# Patient Record
Sex: Female | Born: 1995 | Race: White | Hispanic: No | Marital: Married | Smoking: Never smoker
Health system: Southern US, Community
[De-identification: ages and names within clinical notes are randomized; demographics above are authoritative.]

## PROBLEM LIST (undated history)

## (undated) DIAGNOSIS — K649 Unspecified hemorrhoids: Secondary | ICD-10-CM

## (undated) DIAGNOSIS — D649 Anemia, unspecified: Secondary | ICD-10-CM

## (undated) DIAGNOSIS — B2790 Infectious mononucleosis, unspecified without complication: Secondary | ICD-10-CM

## (undated) DIAGNOSIS — S42302A Unspecified fracture of shaft of humerus, left arm, initial encounter for closed fracture: Secondary | ICD-10-CM

## (undated) HISTORY — DX: Infectious mononucleosis, unspecified without complication: B27.90

## (undated) HISTORY — DX: Unspecified hemorrhoids: K64.9

## (undated) HISTORY — DX: Unspecified fracture of shaft of humerus, left arm, initial encounter for closed fracture: S42.302A

## (undated) HISTORY — PX: OTHER SURGICAL HISTORY: SHX169

---
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
## 2017-08-22 ENCOUNTER — Ambulatory Visit: Payer: Managed Care, Other (non HMO) | Admitting: Physician Assistant

## 2017-08-22 ENCOUNTER — Encounter: Payer: Self-pay | Admitting: Physician Assistant

## 2017-08-22 VITALS — BP 102/74 | HR 80 | Temp 98.0°F | Resp 16 | Ht 63.0 in | Wt 110.0 lb

## 2017-08-22 DIAGNOSIS — N926 Irregular menstruation, unspecified: Secondary | ICD-10-CM | POA: Diagnosis not present

## 2017-08-22 DIAGNOSIS — Z13220 Encounter for screening for lipoid disorders: Secondary | ICD-10-CM | POA: Diagnosis not present

## 2017-08-22 DIAGNOSIS — Z1329 Encounter for screening for other suspected endocrine disorder: Secondary | ICD-10-CM

## 2017-08-22 DIAGNOSIS — Z0000 Encounter for general adult medical examination without abnormal findings: Secondary | ICD-10-CM | POA: Diagnosis not present

## 2017-08-22 DIAGNOSIS — Z23 Encounter for immunization: Secondary | ICD-10-CM

## 2017-08-22 DIAGNOSIS — Z2821 Immunization not carried out because of patient refusal: Secondary | ICD-10-CM | POA: Diagnosis not present

## 2017-08-22 DIAGNOSIS — Z131 Encounter for screening for diabetes mellitus: Secondary | ICD-10-CM | POA: Diagnosis not present

## 2017-08-22 DIAGNOSIS — Z114 Encounter for screening for human immunodeficiency virus [HIV]: Secondary | ICD-10-CM

## 2017-08-22 NOTE — Progress Notes (Signed)
Patient: Linda Mendez Female    DOB: 1995/08/27   21 y.o.   MRN: [id number] Visit Date: 08/22/2017  Today's Provider: Trey Sailors, PA-C   Chief Complaint  Patient presents with  . Establish Care   Subjective:    HPI   Linda Mendez is a 22 y/o woman presenting today to establish care. She recently graduated from [employer] where she studied psychology and neuroscience. She is working as a Architectural technologist for first grade.  She is living in [place] with her husband of five months, feels safe and supported in her relationship. She is sexually active, uses condoms for contraception. She has never had a PAP smear.  She reports she has been having issues with her menstrual cycles lately. She says for several years, she has been having periods every other month. In the past year, however, she has been having periods every month. In the first few days, she reports a heavy flow and severe cramping. Then, she reports she will have extended bleeding and spotting.   Also reports difficulty having bowel movements, denies constipation. Says she feels rectal contractions occasionally when having bowel movement. Denies blood in her stool.     Allergies  Allergen Reactions  . Penicillins Hives    No current outpatient medications on file.  Review of Systems  Constitutional: Negative.   HENT: Negative.   Eyes: Negative.   Respiratory: Negative.   Cardiovascular: Negative.   Gastrointestinal: Positive for abdominal pain and rectal pain.  Endocrine: Negative.   Genitourinary: Positive for vaginal pain.  Musculoskeletal: Negative.   Skin: Negative.   Allergic/Immunologic: Positive for environmental allergies.  Neurological: Negative.   Hematological: Negative.   Psychiatric/Behavioral: Negative.     Social History   Tobacco Use  . Smoking status: Never Smoker  . Smokeless tobacco: Never Used  Substance Use Topics  . Alcohol use: Yes    Comment: Occasionally   Objective:     BP 102/74 (BP Location: Right Arm, Patient Position: Sitting, Cuff Size: Normal)   Pulse 80   Temp 98 F (36.7 C) (Oral)   Resp 16   Ht 5\' 3"  (1.6 m)   Wt 110 lb (49.9 kg)   LMP 08/08/2017   BMI 19.49 kg/m  Vitals:   08/22/17 1535  BP: 102/74  Pulse: 80  Resp: 16  Temp: 98 F (36.7 C)  TempSrc: Oral  Weight: 110 lb (49.9 kg)  Height: 5\' 3"  (1.6 m)     Physical Exam  Constitutional: She is oriented to person, place, and time. She appears well-developed and well-nourished.  HENT:  Right Ear: Tympanic membrane and external ear normal.  Left Ear: Tympanic membrane and external ear normal.  Eyes: Conjunctivae are normal.  Neck: Neck supple.  Cardiovascular: Normal rate and regular rhythm.  Pulmonary/Chest: Effort normal and breath sounds normal.  Abdominal: Soft. Bowel sounds are normal.  Lymphadenopathy:    She has no cervical adenopathy.  Neurological: She is alert and oriented to person, place, and time.  Skin: Skin is warm and dry.  Psychiatric: She has a normal mood and affect. Her behavior is normal.        Assessment & Plan:     1. Encounter for medical examination to establish care  - CBC with Differential  2. Irregular periods  Discussed etiologies of menstrual irregularities. Menorrhagia with extended menstrual cycle, DDx: endometriosis vs  PCOS. Discussed initial intervention would likely be form of birth control including OCPs, implants, patch,  nuva ring. Patient says she would like to discuss with her husband. Will follow up 1 mo for CPE w/ Pap where we can discuss her decision. May get fasting labs before then.  3. Need for Td vaccination  Updated today.  4. Influenza vaccination declined   5. Encounter for screening for HIV  - HIV antibody (with reflex)  6. Screening cholesterol level  - Lipid Profile  7. Thyroid disorder screen  - TSH  8. Diabetes mellitus screening  - Comprehensive Metabolic Panel (CMET)  Return in about 1 month  (around 09/22/2017) for CPE with PAP .  The entirety of the information documented in the History of Present Illness, Review of Systems and Physical Exam were personally obtained by me. Portions of this information were initially documented by Kavin LeechLaura Walsh, CMA and reviewed by me for thoroughness and accuracy.        Trey SailorsAdriana M Pollak, PA-C  [facility]

## 2017-08-22 NOTE — Patient Instructions (Signed)

## 2017-08-23 NOTE — Addendum Note (Signed)
Addended by: Kavin LeechWALSH, LAURA E on: 08/23/2017 08:39 AM   Modules accepted: Orders

## 2017-09-13 ENCOUNTER — Telehealth: Payer: Self-pay

## 2017-09-13 LAB — COMPREHENSIVE METABOLIC PANEL
ALT: 21 IU/L (ref 0–32)
AST: 22 IU/L (ref 0–40)
Albumin/Globulin Ratio: 1.8 (ref 1.2–2.2)
Albumin: 4.6 g/dL (ref 3.5–5.5)
Alkaline Phosphatase: 43 IU/L (ref 39–117)
BUN/Creatinine Ratio: 24 — ABNORMAL HIGH (ref 9–23)
BUN: 15 mg/dL (ref 6–20)
Bilirubin Total: 0.5 mg/dL (ref 0.0–1.2)
CO2: 22 mmol/L (ref 20–29)
Calcium: 9.3 mg/dL (ref 8.7–10.2)
Chloride: 103 mmol/L (ref 96–106)
Creatinine, Ser: 0.63 mg/dL (ref 0.57–1.00)
GFR calc Af Amer: 146 mL/min/{1.73_m2} (ref >59)
GFR calc non Af Amer: 127 mL/min/{1.73_m2} (ref >59)
Globulin, Total: 2.5 g/dL (ref 1.5–4.5)
Glucose: 83 mg/dL (ref 65–99)
Potassium: 4.1 mmol/L (ref 3.5–5.2)
Sodium: 140 mmol/L (ref 134–144)
Total Protein: 7.1 g/dL (ref 6.0–8.5)

## 2017-09-13 LAB — CBC WITH DIFFERENTIAL/PLATELET
Basophils Absolute: 0 10*3/uL (ref 0.0–0.2)
Basos: 1 %
EOS (ABSOLUTE): 0.1 10*3/uL (ref 0.0–0.4)
Eos: 2 %
Hematocrit: 38 % (ref 34.0–46.6)
Hemoglobin: 13.2 g/dL (ref 11.1–15.9)
Immature Grans (Abs): 0 10*3/uL (ref 0.0–0.1)
Immature Granulocytes: 0 %
Lymphocytes Absolute: 1.5 10*3/uL (ref 0.7–3.1)
Lymphs: 43 %
MCH: 30.9 pg (ref 26.6–33.0)
MCHC: 34.7 g/dL (ref 31.5–35.7)
MCV: 89 fL (ref 79–97)
Monocytes Absolute: 0.5 10*3/uL (ref 0.1–0.9)
Monocytes: 15 %
Neutrophils Absolute: 1.4 10*3/uL (ref 1.4–7.0)
Neutrophils: 39 %
Platelets: 236 10*3/uL (ref 150–379)
RBC: 4.27 x10E6/uL (ref 3.77–5.28)
RDW: 13.2 % (ref 12.3–15.4)
WBC: 3.5 10*3/uL (ref 3.4–10.8)

## 2017-09-13 LAB — TSH: TSH: 2.31 u[IU]/mL (ref 0.450–4.500)

## 2017-09-13 LAB — LIPID PANEL
Chol/HDL Ratio: 2.4 ratio (ref 0.0–4.4)
Cholesterol, Total: 134 mg/dL (ref 100–199)
HDL: 55 mg/dL (ref >39)
LDL Calculated: 69 mg/dL (ref 0–99)
Triglycerides: 49 mg/dL (ref 0–149)
VLDL Cholesterol Cal: 10 mg/dL (ref 5–40)

## 2017-09-13 LAB — HIV ANTIBODY (ROUTINE TESTING W REFLEX): HIV Screen 4th Generation wRfx: NONREACTIVE

## 2017-09-13 NOTE — Telephone Encounter (Signed)
-----   Message from Trey SailorsAdriana M Pollak, [place]-C sent at 09/13/2017  9:23 AM EST ----- Labwork all normal.

## 2017-09-13 NOTE — Telephone Encounter (Signed)
Tried calling, pt's voicemail box is full.   Thanks,   -Daphnie Venturini  

## 2017-09-15 NOTE — Telephone Encounter (Signed)
Mailbox full.  Thanks,   -Vernona RiegerLaura

## 2017-09-19 NOTE — Telephone Encounter (Signed)
Pt advised.   Thanks,   -Aubrii Sharpless  

## 2017-09-29 ENCOUNTER — Encounter: Payer: Self-pay | Admitting: Physician Assistant

## 2017-09-29 ENCOUNTER — Ambulatory Visit (INDEPENDENT_AMBULATORY_CARE_PROVIDER_SITE_OTHER): Payer: Managed Care, Other (non HMO) | Admitting: Physician Assistant

## 2017-09-29 VITALS — BP 112/64 | HR 88 | Temp 98.1°F | Ht 63.0 in | Wt 108.0 lb

## 2017-09-29 DIAGNOSIS — Z124 Encounter for screening for malignant neoplasm of cervix: Secondary | ICD-10-CM

## 2017-09-29 DIAGNOSIS — Z0000 Encounter for general adult medical examination without abnormal findings: Secondary | ICD-10-CM

## 2017-09-29 DIAGNOSIS — N920 Excessive and frequent menstruation with regular cycle: Secondary | ICD-10-CM

## 2017-09-29 DIAGNOSIS — Z0001 Encounter for general adult medical examination with abnormal findings: Secondary | ICD-10-CM | POA: Diagnosis not present

## 2017-09-29 MED ORDER — NORETHIN ACE-ETH ESTRAD-FE 1-20 MG-MCG PO TABS: 1.0000 | ORAL_TABLET | Freq: Every day | ORAL | 3 refills | Status: DC

## 2017-09-29 NOTE — Progress Notes (Signed)
Patient: Linda Mendez, Female    DOB: 1996/04/26, 22 y.o.   MRN: [id number] Visit Date: 10/02/2017  Today's Provider: Trinna Post, PA-C   Chief Complaint  Patient presents with  . Annual Exam  . Gynecologic Exam   Subjective:    Annual physical exam Linda Mendez is a 22 y.o. female who presents today for health maintenance and complete physical. She feels fairly well. She reports exercising some. She reports she is sleeping fairly well.  She is due for her first PAP smear today.   Additionally, she has given some thought to our prior discussion about heavy periods. She would like to try OCP in order to regulate this. She denies history of heart attack, stroke, DVT, migraine headaches, breast cancer. She does not smoke and does not have hypertension. She would like to try combo OCP. She is currently sexually active with her husband.  -----------------------------------------------------------------   Review of Systems   12 point ROS conducted and are negative except for positives listed in HPI.   Social History      She  reports that  has never smoked. she has never used smokeless tobacco. She reports that she drinks alcohol. She reports that she does not use drugs.       Social History   Socioeconomic History  . Marital status: Married    Spouse name: None  . Number of children: None  . Years of education: None  . Highest education level: None  Social Needs  . Financial resource strain: None  . Food insecurity - worry: None  . Food insecurity - inability: None  . Transportation needs - medical: None  . Transportation needs - non-medical: None  Occupational History  . None  Tobacco Use  . Smoking status: Never Smoker  . Smokeless tobacco: Never Used  Substance and Sexual Activity  . Alcohol use: Yes    Comment: Occasionally  . Drug use: No  . Sexual activity: Yes    Partners: Male    Birth control/protection: Condom  Other Topics Concern  . None    Social History Narrative  . None    History reviewed. No pertinent past medical history.   There are no active problems to display for this patient.   Past Surgical History:  Procedure Laterality Date  . FRACTURE SURGERY Left 2003   Left arm     Family History        Family Status  Relation Name Status  . Mother  Alive  . Father  Alive  . PGF  Alive        Her family history includes Hyperlipidemia in her father and paternal grandfather; Hypersomnolence in her mother; Migraines in her mother; Thyroid disease in her mother.      Allergies  Allergen Reactions  . Penicillins Hives     Current Outpatient Medications:  .  norethindrone-ethinyl estradiol (JUNEL FE 1/20) 1-20 MG-MCG tablet, Take 1 tablet by mouth daily., Disp: 3 Package, Rfl: 3   Patient Care Team: Paulene Floor as PCP - General (Physician Assistant)      Objective:   Vitals: BP 112/64 (BP Location: Right Arm, Patient Position: Sitting, Cuff Size: Normal)   Pulse 88   Temp 98.1 F (36.7 C) (Oral)   Ht _0  (1.6 m)   Wt 108 lb (49 kg)   LMP 09/15/2017   SpO2 98%   BMI 19.13 kg/m    Vitals:   09/29/17 1512  BP: 112/64  Pulse: 88  Temp: 98.1 F (36.7 C)  TempSrc: Oral  SpO2: 98%  Weight: 108 lb (49 kg)  Height: _0  (1.6 m)     Physical Exam  Constitutional: She is oriented to person, place, and time. She appears well-developed and well-nourished.  HENT:  Right Ear: External ear normal.  Left Ear: External ear normal.  Mouth/Throat: Oropharynx is clear and moist.  Eyes: Conjunctivae are normal.  Neck: Neck supple.  Cardiovascular: Normal rate and regular rhythm.  Pulmonary/Chest: Effort normal and breath sounds normal. Right breast exhibits no inverted nipple, no mass, no nipple discharge, no skin change and no tenderness. Left breast exhibits no inverted nipple, no mass, no nipple discharge, no skin change and no tenderness. Breasts are symmetrical.  Abdominal: Soft.  Bowel sounds are normal.  Genitourinary: Uterus normal. No breast swelling, tenderness, discharge or bleeding. No labial fusion. There is no rash, tenderness, lesion or injury on the right labia. There is no rash, tenderness, lesion or injury on the left labia. Cervix exhibits no motion tenderness, no discharge and no friability. Right adnexum displays no mass, no tenderness and no fullness. Left adnexum displays no mass, no tenderness and no fullness. No erythema or bleeding in the vagina. No foreign body in the vagina. No signs of injury around the vagina. No vaginal discharge found.  Lymphadenopathy:    She has no cervical adenopathy.  Neurological: She is alert and oriented to person, place, and time.  Skin: Skin is warm and dry.  Psychiatric: She has a normal mood and affect. Her behavior is normal.     Depression Screen PHQ 2/9 Scores 08/23/2017 08/23/2017  PHQ - 2 Score 0 0  PHQ- 9 Score 2 2      Assessment & Plan:     Routine Health Maintenance and Physical Exam  Exercise Activities and Dietary recommendations Goals    None      Immunization History  Administered Date(s) Administered  . DTaP 11/15/1994, 01/31/1995, 03/13/1995, 03/22/1996, 10/05/1998  . Hepatitis A 02/08/2007, 08/14/2007  . Hepatitis B 09/27/1994, 11/15/1994, 06/13/1995  . HiB (PRP-OMP) 11/15/1994, 01/31/1995, 03/11/1995, 03/22/1996  . Hpv 02/08/2007, 04/11/2007, 08/14/2007  . IPV 11/15/1994, 01/28/1995, 03/22/1996, 10/05/1998  . MMR 12/12/1995, 10/05/1998  . Meningococcal Conjugate 04/11/2007  . Meningococcal Mcv4o 08/24/2011  . Td 08/22/2017  . Tdap 02/08/2007  . Varicella 09/14/1995, 02/08/2007    Health Maintenance  Topic Date Due  . PAP SMEAR  09/13/2015  . INFLUENZA VACCINE  03/15/2018 (Originally 03/15/2017)  . TETANUS/TDAP  08/23/2027  . HIV Screening  Completed     Discussed health benefits of physical activity, and encouraged her to engage in regular exercise appropriate for her age  and condition.    1. Annual physical exam   2. Cervical cancer screening  - Pap IG, CT/NG w/ reflex HPV when ASC-U  3. Menorrhagia with regular cycle  Counseled on small increased risk of clotting on combo OCP. Reviewed other options for regulating cycles. Counseled on side effects including N/V, increased acne and breakthrough bleeding for first three months of use.   - norethindrone-ethinyl estradiol (JUNEL FE 1/20) 1-20 MG-MCG tablet; Take 1 tablet by mouth daily.  Dispense: 3 Package; Refill: 3  Return in about 6 months (around 03/29/2018) for birth control .  The entirety of the information documented in the History of Present Illness, Review of Systems and Physical Exam were personally obtained by me. Portions of this information were initially documented by Ashley Royalty, CMA  and reviewed by me for thoroughness and accuracy.    --------------------------------------------------------------------    Trinna Post, PA-C  [facility]

## 2017-09-29 NOTE — Patient Instructions (Signed)

## 2017-10-02 ENCOUNTER — Encounter: Payer: Self-pay | Admitting: Physician Assistant

## 2017-10-02 DIAGNOSIS — N920 Excessive and frequent menstruation with regular cycle: Secondary | ICD-10-CM | POA: Insufficient documentation

## 2017-10-03 LAB — PAP IG, CT-NG, RFX HPV ASCU
Chlamydia, Nuc. Acid Amp: NEGATIVE
Gonococcus by Nucleic Acid Amp: NEGATIVE
PAP Smear Comment: 0

## 2017-10-04 ENCOUNTER — Telehealth: Payer: Self-pay

## 2017-10-04 NOTE — Telephone Encounter (Signed)
-----   Message from Trey SailorsAdriana M Pollak, [place]PA-C sent at 10/03/2017 11:46 AM EST ----- Pap normal, Gonorrhea and chlamydia are negative. Repeat in 3 years.

## 2017-10-04 NOTE — Telephone Encounter (Signed)
Tried calling; unable to leave a VM.  Thanks,   -Vernona RiegerLaura

## 2017-10-05 NOTE — Telephone Encounter (Signed)
Patient advised.KW 

## 2018-02-11 ENCOUNTER — Encounter: Payer: Self-pay | Admitting: Physician Assistant

## 2018-02-11 DIAGNOSIS — R1084 Generalized abdominal pain: Secondary | ICD-10-CM

## 2018-04-03 ENCOUNTER — Encounter: Payer: Self-pay | Admitting: Gastroenterology

## 2018-04-03 ENCOUNTER — Ambulatory Visit (INDEPENDENT_AMBULATORY_CARE_PROVIDER_SITE_OTHER): Payer: Managed Care, Other (non HMO) | Admitting: Gastroenterology

## 2018-04-03 VITALS — BP 113/80 | HR 98 | Ht 63.0 in | Wt 110.0 lb

## 2018-04-03 DIAGNOSIS — R1084 Generalized abdominal pain: Secondary | ICD-10-CM

## 2018-04-03 DIAGNOSIS — K5900 Constipation, unspecified: Secondary | ICD-10-CM | POA: Diagnosis not present

## 2018-04-03 DIAGNOSIS — K644 Residual hemorrhoidal skin tags: Secondary | ICD-10-CM | POA: Diagnosis not present

## 2018-04-03 MED ORDER — HYDROCORTISONE 1 % EX OINT: 1.0000 "application " | TOPICAL_OINTMENT | Freq: Every day | CUTANEOUS | 0 refills | Status: AC

## 2018-04-03 NOTE — Progress Notes (Signed)
[facility]. Pt aware she will receive a phone call from them to schedule in 1-2 weeks. To contact office if she does not hear anything.

## 2018-04-03 NOTE — Patient Instructions (Signed)
F/U 3 months Miralax every other day  Hemorrhoids Hemorrhoids are swollen veins in and around the rectum or anus. There are two types of hemorrhoids:  Internal hemorrhoids. These occur in the veins that are just inside the rectum. They may poke through to the outside and become irritated and painful.  External hemorrhoids. These occur in the veins that are outside of the anus and can be felt as a painful swelling or hard lump near the anus.  Most hemorrhoids do not cause serious problems, and they can be managed with home treatments such as diet and lifestyle changes. If home treatments do not help your symptoms, procedures can be done to shrink or remove the hemorrhoids. What are the causes? This condition is caused by increased pressure in the anal area. This pressure may result from various things, including:  Constipation.  Straining to have a bowel movement.  Diarrhea.  Pregnancy.  Obesity.  Sitting for long periods of time.  Heavy lifting or other activity that causes you to strain.  Anal sex.  What are the signs or symptoms? Symptoms of this condition include:  Pain.  Anal itching or irritation.  Rectal bleeding.  Leakage of stool (feces).  Anal swelling.  One or more lumps around the anus.  How is this diagnosed? This condition can often be diagnosed through a visual exam. Other exams or tests may also be done, such as:  Examination of the rectal area with a gloved hand (digital rectal exam).  Examination of the anal canal using a small tube (anoscope).  A blood test, if you have lost a significant amount of blood.  A test to look inside the colon (sigmoidoscopy or colonoscopy).  How is this treated? This condition can usually be treated at home. However, various procedures may be done if dietary changes, lifestyle changes, and other home treatments do not help your symptoms. These procedures can help make the hemorrhoids smaller or remove them  completely. Some of these procedures involve surgery, and others do not. Common procedures include:  Rubber band ligation. Rubber bands are placed at the base of the hemorrhoids to cut off the blood supply to them.  Sclerotherapy. Medicine is injected into the hemorrhoids to shrink them.  Infrared coagulation. A type of light energy is used to get rid of the hemorrhoids.  Hemorrhoidectomy surgery. The hemorrhoids are surgically removed, and the veins that supply them are tied off.  Stapled hemorrhoidopexy surgery. A circular stapling device is used to remove the hemorrhoids and use staples to cut off the blood supply to them.  Follow these instructions at home: Eating and drinking  Eat foods that have a lot of fiber in them, such as whole grains, beans, nuts, fruits, and vegetables. Ask your health care provider about taking products that have added fiber (fiber supplements).  Drink enough fluid to keep your urine clear or pale yellow. Managing pain and swelling  Take warm sitz baths for 20 minutes, 3-4 times a day to ease pain and discomfort.  If directed, apply ice to the affected area. Using ice packs between sitz baths may be helpful. ? Put ice in a plastic bag. ? Place a towel between your skin and the bag. ? Leave the ice on for 20 minutes, 2-3 times a day. General instructions  Take over-the-counter and prescription medicines only as told by your health care provider.  Use medicated creams or suppositories as told.  Exercise regularly.  Go to the bathroom when you have the urge  to have a bowel movement. Do not wait.  Avoid straining to have bowel movements.  Keep the anal area dry and clean. Use wet toilet paper or moist towelettes after a bowel movement.  Do not sit on the toilet for long periods of time. This increases blood pooling and pain. Contact a health care provider if:  You have increasing pain and swelling that are not controlled by treatment or  medicine.  You have uncontrolled bleeding.  You have difficulty having a bowel movement, or you are unable to have a bowel movement.  You have pain or inflammation outside the area of the hemorrhoids. This information is not intended to replace advice given to you by your health care provider. Make sure you discuss any questions you have with your health care provider. Document Released: 07/29/2000 Document Revised: 12/30/2015 Document Reviewed: 04/15/2015 Elsevier Interactive Patient Education  2018 Elsevier Inc.  High-Fiber Diet Fiber, also called dietary fiber, is a type of carbohydrate found in fruits, vegetables, whole grains, and beans. A high-fiber diet can have many health benefits. Your health care provider may recommend a high-fiber diet to help:  Prevent constipation. Fiber can make your bowel movements more regular.  Lower your cholesterol.  Relieve hemorrhoids, uncomplicated diverticulosis, or irritable bowel syndrome.  Prevent overeating as part of a weight-loss plan.  Prevent heart disease, type 2 diabetes, and certain cancers.  What is my plan? The recommended daily intake of fiber includes:  38 grams for men under age 23.  30 grams for men over age 23.  25 grams for women under age 23.  21 grams for women over age 23.  You can get the recommended daily intake of dietary fiber by eating a variety of fruits, vegetables, grains, and beans. Your health care provider may also recommend a fiber supplement if it is not possible to get enough fiber through your diet. What do I need to know about a high-fiber diet?  Fiber supplements have not been widely studied for their effectiveness, so it is better to get fiber through food sources.  Always check the fiber content on thenutrition facts label of any prepackaged food. Look for foods that contain at least 5 grams of fiber per serving.  Ask your dietitian if you have questions about specific foods that are related  to your condition, especially if those foods are not listed in the following section.  Increase your daily fiber consumption gradually. Increasing your intake of dietary fiber too quickly may cause bloating, cramping, or gas.  Drink plenty of water. Water helps you to digest fiber. What foods can I eat? Grains Whole-grain breads. Multigrain cereal. Oats and oatmeal. Brown rice. Barley. Bulgur wheat. Millet. Bran muffins. Popcorn. Rye wafer crackers. Vegetables Sweet potatoes. Spinach. Kale. Artichokes. Cabbage. Broccoli. Green peas. Carrots. Squash. Fruits Berries. Pears. Apples. Oranges. Avocados. Prunes and raisins. Dried figs. Meats and Other Protein Sources Navy, kidney, pinto, and soy beans. Split peas. Lentils. Nuts and seeds. Dairy Fiber-fortified yogurt. Beverages Fiber-fortified soy milk. Fiber-fortified orange juice. Other Fiber bars. The items listed above may not be a complete list of recommended foods or beverages. Contact your dietitian for more options. What foods are not recommended? Grains White bread. Pasta made with refined flour. White rice. Vegetables Fried potatoes. Canned vegetables. Well-cooked vegetables. Fruits Fruit juice. Cooked, strained fruit. Meats and Other Protein Sources Fatty cuts of meat. Fried Environmental education officerpoultry or fried fish. Dairy Milk. Yogurt. Cream cheese. Sour cream. Beverages Soft drinks. Other Cakes and pastries. Butter and  oils. The items listed above may not be a complete list of foods and beverages to avoid. Contact your dietitian for more information. What are some tips for including high-fiber foods in my diet?  Eat a wide variety of high-fiber foods.  Make sure that half of all grains consumed each day are whole grains.  Replace breads and cereals made from refined flour or white flour with whole-grain breads and cereals.  Replace white rice with brown rice, bulgur wheat, or millet.  Start the day with a breakfast that is high in  fiber, such as a cereal that contains at least 5 grams of fiber per serving.  Use beans in place of meat in soups, salads, or pasta.  Eat high-fiber snacks, such as berries, raw vegetables, nuts, or popcorn. This information is not intended to replace advice given to you by your health care provider. Make sure you discuss any questions you have with your health care provider. Document Released: 08/01/2005 Document Revised: 01/07/2016 Document Reviewed: 01/14/2014 Elsevier Interactive Patient Education  [employer].

## 2018-04-03 NOTE — Progress Notes (Signed)
Linda Mendez [address]  [address]  [place], [place] [address]  Main: [phone]  Fax: [phone]   Gastroenterology Consultation  Referring Provider:     Maryella ShiversPollak, Adriana M, PA-C Primary Care Physician:  Maryella ShiversPollak, Adriana M, PA-C Primary Gastroenterologist:  Dr. Melodie BouillonVarnita Mendez Reason for Consultation:     Abdominal pain        HPI:    Chief Complaint  Patient presents with  . New Patient (Initial Visit)    abdominal pain times 1 year or so  . Constipation    prior to cycles  . Abdominal Pain  . Bloated  . Rectal Pain    Linda Mendez is a 22 y.o. y/o female referred for consultation & management  by Dr. Jodi MarblePollak, Lavella HammockAdriana M, PA-C.  Patient reports intermittent bilateral lower quadrant abdominal pain, but mostly occurs around her periods.  States she starts having the pain when her period started, also starts having constipation 1 to 2 days before her period started, and then loose stools during her periods.  The pain lasts throughout their periods.  Cramping, 5/10, radiating to her back.  Outside of her periods, the pain is very infrequent.  Outside of her periods, reports formed regular bowel movements once daily.  Also reports intermittent bright red blood per rectum on the toilet paper and blood streaks in stool about once a month.  No weight loss, heartburn, dysphagia, or family history of colon cancer.  No prior EGD or colonoscopy.  No NSAIDs.  Also reports some discomfort with intercourse.  No abdominal surgeries. States when she has her abdominal pain, it feels better after a bowel movement. Sometimes the pain is in the mid epigastric region and it gets better after bowel movement.  History reviewed. No pertinent past medical history.  Past Surgical History:  Procedure Laterality Date  . FRACTURE SURGERY Left 2003   Left arm     Prior to Admission medications   Medication Sig Start Date End Date Taking? Authorizing Provider  polyethylene glycol  (MIRALAX / GLYCOLAX) packet Take 17 g by mouth every other day.   Yes [provider]  hydrocortisone 1 % ointment Apply 1 application topically at bedtime for 7 days. 04/03/18 04/10/18  Pasty Spillersahiliani, Varnita B, MD  norethindrone-ethinyl estradiol (JUNEL FE 1/20) 1-20 MG-MCG tablet Take 1 tablet by mouth daily. 09/29/17   Trey SailorsPollak, Adriana M, PA-C    Family History  Problem Relation Age of Onset  . Thyroid disease Mother   . Migraines Mother   . Hypersomnolence Mother   . Hyperlipidemia Father   . Hyperlipidemia Paternal Grandfather      Social History   Tobacco Use  . Smoking status: Never Smoker  . Smokeless tobacco: Never Used  Substance Use Topics  . Alcohol use: Yes    Comment: Occasionally  . Drug use: No    Allergies as of 04/03/2018 - Review Complete 04/03/2018  Allergen Reaction Noted  . Amoxicillin Hives 04/03/2018  . Penicillins Hives 06/23/2017    Review of Systems:    All systems reviewed and negative except where noted in HPI.   Physical Exam:  BP 113/80   Pulse 98   Ht 5\' 3"  (1.6 m)   Wt 110 lb (49.9 kg)   BMI 19.49 kg/m  No LMP recorded. Psych:  Alert and cooperative. Normal mood and affect. General:   Alert,  Well-developed, well-nourished, pleasant and cooperative in NAD Head:  Normocephalic and atraumatic. Eyes:  Sclera clear, no icterus.  Conjunctiva pink. Ears:  Normal auditory acuity. Nose:  No deformity, discharge, or lesions. Mouth:  No deformity or lesions,oropharynx pink & moist. Neck:  Supple; no masses or thyromegaly. Abdomen:  Normal bowel sounds.  No bruits.  Soft, non-tender and non-distended without masses, hepatosplenomegaly or hernias noted.  No guarding or rebound tenderness.   Rectal: External hemorrhoid present No bright blood per rectum, no masses or stool in rectal vault Msk:  Symmetrical without gross deformities. Good, equal movement & strength bilaterally. Pulses:  Normal pulses noted. Extremities:  No clubbing or  edema.  No cyanosis. Neurologic:  Alert and oriented x3;  grossly normal neurologically. Skin:  Intact without significant lesions or rashes. No jaundice. Lymph Nodes:  No significant cervical adenopathy. Psych:  Alert and cooperative. Normal mood and affect.   Labs: CBC    Component Value Date/Time   WBC 3.5 09/12/2017 0748   RBC 4.27 09/12/2017 0748   HGB 13.2 09/12/2017 0748   HCT 38.0 09/12/2017 0748   PLT 236 09/12/2017 0748   MCV 89 09/12/2017 0748   MCH 30.9 09/12/2017 0748   MCHC 34.7 09/12/2017 0748   RDW 13.2 09/12/2017 0748   LYMPHSABS 1.5 09/12/2017 0748   EOSABS 0.1 09/12/2017 0748   BASOSABS 0.0 09/12/2017 0748   CMP     Component Value Date/Time   NA 140 09/12/2017 0748   K 4.1 09/12/2017 0748   CL 103 09/12/2017 0748   CO2 22 09/12/2017 0748   GLUCOSE 83 09/12/2017 0748   BUN 15 09/12/2017 0748   CREATININE 0.63 09/12/2017 0748   CALCIUM 9.3 09/12/2017 0748   PROT 7.1 09/12/2017 0748   ALBUMIN 4.6 09/12/2017 0748   AST 22 09/12/2017 0748   ALT 21 09/12/2017 0748   ALKPHOS 43 09/12/2017 0748   BILITOT 0.5 09/12/2017 0748   GFRNONAA 127 09/12/2017 0748   GFRAA 146 09/12/2017 0748    Imaging Studies: No results found.  Assessment and Plan:   Linda Mendez is a 22 y.o. y/o female has been referred for intermittent abdominal pain, mostly in the bilateral lower quadrant and occurring very related to start of her menstrual cycle, with associated constipation 2 days before the start of her menses  Clinical symptoms could be related to underlying endometriosis given that they specifically occur around her menses Patient also reports discomfort with sexual intercourse Will refer to GYN for evaluation of endometriosis  Since pain gets better after bowel movement, and external hemorrhoids were seen on rectal exam today, pain could also be related to constipation However, patient reports regular bowel habits outside of her menses We will start MiraLAX  every other day to see if it helps with her pain We will also obtain stool for H. pylori  Patient states her external hemorrhoid has been present for 1 year, and it bothers her when she sees bright red blood per rectum and sometimes it is painful Will prescribe hydrocortisone ointment for 1 week start high-fiber diet MiraLAX every other day with goal of 1-2 soft problems daily.  Patient asked to titrate this medication to obtain goal. Patient would also like referral to surgery for evaluation of hemorrhoidectomy.  Since it has been present for 1 year, and it bothers the patient, is present despite her reporting soft normal regular bowel movements daily, will place referral to surgery  Recent lab work otherwise reassuring, with normal hemoglobin, MCV and normal liver enzymes.   Dr Linda Mendez

## 2018-05-26 ENCOUNTER — Encounter: Payer: Self-pay | Admitting: Physician Assistant

## 2018-05-28 ENCOUNTER — Other Ambulatory Visit: Payer: Self-pay | Admitting: Physician Assistant

## 2018-05-28 DIAGNOSIS — R109 Unspecified abdominal pain: Secondary | ICD-10-CM

## 2018-06-07 ENCOUNTER — Ambulatory Visit (INDEPENDENT_AMBULATORY_CARE_PROVIDER_SITE_OTHER): Payer: Managed Care, Other (non HMO) | Admitting: Physician Assistant

## 2018-06-07 ENCOUNTER — Encounter: Payer: Self-pay | Admitting: Physician Assistant

## 2018-06-07 VITALS — BP 110/66 | HR 71 | Temp 98.0°F | Resp 16 | Wt 107.0 lb

## 2018-06-07 DIAGNOSIS — N3000 Acute cystitis without hematuria: Secondary | ICD-10-CM

## 2018-06-07 DIAGNOSIS — R300 Dysuria: Secondary | ICD-10-CM

## 2018-06-07 LAB — POCT URINALYSIS DIPSTICK
Bilirubin, UA: NEGATIVE
Glucose, UA: NEGATIVE
Ketones, UA: NEGATIVE
Nitrite, UA: NEGATIVE
Protein, UA: POSITIVE — AB
Spec Grav, UA: 1.02 (ref 1.010–1.025)
Urobilinogen, UA: 0.2 E.U./dL
pH, UA: 6 (ref 5.0–8.0)

## 2018-06-07 MED ORDER — SULFAMETHOXAZOLE-TRIMETHOPRIM 800-160 MG PO TABS: 1.0000 | ORAL_TABLET | Freq: Two times a day (BID) | ORAL | 0 refills | Status: DC

## 2018-06-07 NOTE — Progress Notes (Signed)
       Patient: Linda Mendez Female    DOB: 1995/09/24   22 y.o.   MRN: [id number] Visit Date: 06/07/2018  Today's Provider: Trey Sailors, PA-C   Chief Complaint  Patient presents with  . Urinary Tract Infection   Subjective:    HPI Urinary Tract Infection: Patient complains of burning with urination She has had symptoms for 4 days. Patient also complains of stomach ache. Patient denies fever. Patient does not have a history of recurrent UTI.  Patient does not have a history of pyelonephritis.   Patient reports dripping of bright red blood into the toilet bowl. She believes this is coming from her urinary tract. She uses diva cup for menstrual cycle and was using that when this occurred.     Allergies  Allergen Reactions  . Amoxicillin Hives  . Penicillins Hives     Current Outpatient Medications:  .  norethindrone-ethinyl estradiol (JUNEL FE 1/20) 1-20 MG-MCG tablet, Take 1 tablet by mouth daily., Disp: 3 Package, Rfl: 3 .  polyethylene glycol (MIRALAX / GLYCOLAX) packet, Take 17 g by mouth every other day., Disp: , Rfl:   Review of Systems  Constitutional: Positive for chills.  Gastrointestinal: Positive for nausea.  Genitourinary: Positive for difficulty urinating, dysuria, flank pain and hematuria.    Social History   Tobacco Use  . Smoking status: Never Smoker  . Smokeless tobacco: Never Used  Substance Use Topics  . Alcohol use: Yes    Comment: Occasionally   Objective:   BP 110/66 (BP Location: Left Arm, Patient Position: Sitting, Cuff Size: Normal)   Pulse 71   Temp 98 F (36.7 C) (Oral)   Resp 16   Wt 107 lb (48.5 kg)   SpO2 97%   BMI 18.95 kg/m  Vitals:   06/07/18 1531  BP: 110/66  Pulse: 71  Resp: 16  Temp: 98 F (36.7 C)  TempSrc: Oral  SpO2: 97%  Weight: 107 lb (48.5 kg)     Physical Exam  Constitutional: She is oriented to person, place, and time. She appears well-developed and well-nourished. No distress.  Cardiovascular:  Normal rate and regular rhythm.  Pulmonary/Chest: Effort normal and breath sounds normal.  Abdominal: Soft. Bowel sounds are normal. She exhibits no distension. There is tenderness in the suprapubic area. There is no rebound, no guarding and no CVA tenderness.  Neurological: She is alert and oriented to person, place, and time.  Skin: Skin is warm and dry. She is not diaphoretic.  Psychiatric: She has a normal mood and affect. Her behavior is normal.        Assessment & Plan:     1. Dysuria  Will treat for cystitis. Do think blood she sees is likely from external hemorrhoids noted on GI visit exam, especially if symptoms persist after treatment for UTI.  - POCT urinalysis dipstick - Urine culture  2. Acute cystitis without hematuria  - sulfamethoxazole-trimethoprim (BACTRIM DS,SEPTRA DS) 800-160 MG tablet; Take 1 tablet by mouth 2 (two) times daily.  Dispense: 6 tablet; Refill: 0  Return if symptoms worsen or fail to improve.  The entirety of the information documented in the History of Present Illness, Review of Systems and Physical Exam were personally obtained by me. Portions of this information were initially documented by Rondel Baton, CMA and reviewed by me for thoroughness and accuracy.        Trey Sailors, PA-C  [facility]

## 2018-06-07 NOTE — Patient Instructions (Signed)

## 2018-06-09 LAB — URINE CULTURE

## 2018-06-11 NOTE — Progress Notes (Signed)
na

## 2018-06-12 ENCOUNTER — Telehealth: Payer: Self-pay

## 2018-06-12 NOTE — Telephone Encounter (Signed)
-----   Message from Trey Sailors, [place] sent at 06/11/2018  2:58 PM EDT ----- UTI showed E. Coli sensitive to Bactrim.

## 2018-06-12 NOTE — Telephone Encounter (Signed)
Result has been viewed by patient on MyChart

## 2018-07-04 ENCOUNTER — Ambulatory Visit: Payer: Managed Care, Other (non HMO) | Admitting: Gastroenterology

## 2018-07-04 ENCOUNTER — Telehealth: Payer: Self-pay

## 2018-07-04 NOTE — Telephone Encounter (Signed)
Pt dropped off a form for her work about a month ago.   She is checking to see if it has been completed.    Contact number [phone]   Please advise.   Thanks,   -Vernona RiegerLaura

## 2018-07-04 NOTE — Telephone Encounter (Signed)
I don't recall getting a form from her and I do not have it in the paperwork on my desk. Is it possible for her to drop it off again or submit document through MyChart so I may print it? Apologies for the delay.

## 2018-07-05 NOTE — Telephone Encounter (Signed)
Pt will up load into my chart

## 2018-07-06 ENCOUNTER — Encounter: Payer: Self-pay | Admitting: Physician Assistant

## 2018-07-06 NOTE — Telephone Encounter (Signed)
Noted, thanks. Will await the message.

## 2018-12-11 ENCOUNTER — Emergency Department: Payer: BC Managed Care – PPO

## 2018-12-11 ENCOUNTER — Emergency Department
Admission: EM | Admit: 2018-12-11 | Discharge: 2018-12-11 | Disposition: A | Discharge status: Home / Self Care | Payer: BC Managed Care – PPO | Attending: Emergency Medicine | Admitting: Emergency Medicine

## 2018-12-11 ENCOUNTER — Encounter: Payer: Self-pay | Admitting: Emergency Medicine

## 2018-12-11 ENCOUNTER — Other Ambulatory Visit: Payer: Self-pay

## 2018-12-11 DIAGNOSIS — Z79899 Other long term (current) drug therapy: Secondary | ICD-10-CM | POA: Insufficient documentation

## 2018-12-11 DIAGNOSIS — R55 Syncope and collapse: Secondary | ICD-10-CM | POA: Insufficient documentation

## 2018-12-11 LAB — URINALYSIS, COMPLETE (UACMP) WITH MICROSCOPIC
Bilirubin Urine: NEGATIVE
Glucose, UA: NEGATIVE mg/dL
Ketones, ur: NEGATIVE mg/dL
Leukocytes,Ua: NEGATIVE
Nitrite: NEGATIVE
Protein, ur: NEGATIVE mg/dL
Specific Gravity, Urine: 1.008 (ref 1.005–1.030)
Squamous Epithelial / HPF: NONE SEEN (ref 0–5)
pH: 5 (ref 5.0–8.0)

## 2018-12-11 LAB — CBC
HCT: 39.9 % (ref 36.0–46.0)
Hemoglobin: 13.6 g/dL (ref 12.0–15.0)
MCH: 30.4 pg (ref 26.0–34.0)
MCHC: 34.1 g/dL (ref 30.0–36.0)
MCV: 89.1 fL (ref 80.0–100.0)
Platelets: 280 10*3/uL (ref 150–400)
RBC: 4.48 MIL/uL (ref 3.87–5.11)
RDW: 11.6 % (ref 11.5–15.5)
WBC: 4.6 10*3/uL (ref 4.0–10.5)
nRBC: 0 % (ref 0.0–0.2)

## 2018-12-11 LAB — BASIC METABOLIC PANEL
Anion gap: 9 (ref 5–15)
BUN: 16 mg/dL (ref 6–20)
CO2: 25 mmol/L (ref 22–32)
Calcium: 9.3 mg/dL (ref 8.9–10.3)
Chloride: 105 mmol/L (ref 98–111)
Creatinine, Ser: 0.56 mg/dL (ref 0.44–1.00)
GFR calc Af Amer: >60 mL/min (ref >60)
GFR calc non Af Amer: >60 mL/min (ref >60)
Glucose, Bld: 96 mg/dL (ref 70–99)
Potassium: 3.5 mmol/L (ref 3.5–5.1)
Sodium: 139 mmol/L (ref 135–145)

## 2018-12-11 LAB — POCT PREGNANCY, URINE: Preg Test, Ur: NEGATIVE

## 2018-12-11 MED ORDER — SODIUM CHLORIDE 0.9% FLUSH: 3.0000 mL | Freq: Once | INTRAVENOUS | Status: DC

## 2018-12-11 MED ORDER — SODIUM CHLORIDE 0.9 % IV BOLUS
1000.0000 mL | Freq: Once | INTRAVENOUS | Status: AC
  Administered 2018-12-11: 1000 mL via INTRAVENOUS

## 2018-12-11 NOTE — ED Notes (Signed)
Pt states she was standing up hugging her husband when she passed out, he caught her and lowered her to the ground, did not hit her head. Pt states she might have IBS and endometriosis-has not been diagnosed with either at this time.

## 2018-12-11 NOTE — Discharge Instructions (Addendum)
Make an appointment to follow-up with your primary care doctor within the next few weeks.  Return to the ER for new, recurrent, or persistent episodes of passing out or any seizure-like activity.

## 2018-12-11 NOTE — ED Triage Notes (Signed)
Pt states she was at her house this am had just eaten breakfast around 0845 and at 9:15, felt a "little dizzy" then had a syncopal episode witnessed by her husband, states she has been diagnosed with anemia in the past and is on her period now which is very heavy. In triage pt states she is feeling "a little hot and shaky," had to remove her mask to take deep breaths. NAD a this time. Pt is pale.

## 2018-12-11 NOTE — ED Provider Notes (Signed)
[facility] Emergency Department Provider Note ____________________________________________   First MD Initiated Contact with Patient 12/11/18 1506     (approximate)  I have reviewed the triage vital signs and the nursing notes.   HISTORY  Chief Complaint Loss of Consciousness    HPI Linda Mendez is a 23 y.o. female with no significant PMH who presents with an episode of loss of consciousness, acute onset a few hours ago this morning, and causing her to fall.  She states that her husband caught her.  The patient states that she felt slightly dizzy but otherwise had no significant weakness, sweating, nausea, or other prodromal symptoms.  She states that she felt hot and shaky afterwards.  The patient states that her husband noticed that she was shaking her arms during the episode.  It lasted less than a minute and she awoke fairly quickly.  She has not had a similar episode in the past.  The patient denies any other acute symptoms.  She has no fever, cough, shortness of breath, or any GI or urinary symptoms.  History reviewed. No pertinent past medical history.  Patient Active Problem List   Diagnosis Date Noted  . Menorrhagia with regular cycle 10/02/2017    Past Surgical History:  Procedure Laterality Date  . arm surgery    . FRACTURE SURGERY Left 2003   Left arm     Prior to Admission medications   Medication Sig Start Date End Date Taking? Authorizing Provider  norethindrone-ethinyl estradiol (JUNEL FE 1/20) 1-20 MG-MCG tablet Take 1 tablet by mouth daily. 09/29/17   Trey Sailors, PA-C  polyethylene glycol (MIRALAX / GLYCOLAX) packet Take 17 g by mouth every other day.    [provider]  sulfamethoxazole-trimethoprim (BACTRIM DS,SEPTRA DS) 800-160 MG tablet Take 1 tablet by mouth 2 (two) times daily. Patient not taking: Reported on 12/11/2018 06/07/18   Trey Sailors, PA-C    Allergies Amoxicillin and Penicillins  Family  History  Problem Relation Age of Onset  . Thyroid disease Mother   . Migraines Mother   . Hypersomnolence Mother   . Hyperlipidemia Father   . Hyperlipidemia Paternal Grandfather     Social History Social History   Tobacco Use  . Smoking status: Never Smoker  . Smokeless tobacco: Never Used  Substance Use Topics  . Alcohol use: Yes    Comment: Occasionally  . Drug use: No    Review of Systems  Constitutional: No fever/chills. Eyes: No redness. ENT: No sore throat. Cardiovascular: Denies chest pain. Respiratory: Denies shortness of breath. Gastrointestinal: No vomiting or diarrhea.  Genitourinary: Negative for dysuria.  Musculoskeletal: Negative for back pain. Skin: Negative for rash. Neurological: Negative for headaches, focal weakness or numbness.   ____________________________________________   PHYSICAL EXAM:  VITAL SIGNS: ED Triage Vitals  Enc Vitals Group     BP 12/11/18 1405 131/89     Pulse Rate 12/11/18 1405 (!) 111     Resp 12/11/18 1405 18     Temp 12/11/18 1405 98.9 F (37.2 C)     Temp Source 12/11/18 1405 Oral     SpO2 12/11/18 1405 99 %     Weight 12/11/18 1406 115 lb (52.2 kg)     Height 12/11/18 1406 5\' 3"  (1.6 m)     Head Circumference --      Peak Flow --      Pain Score 12/11/18 1406 0     Pain Loc --      Pain  Edu? --      Excl. in GC? --     Constitutional: Alert and oriented. Well appearing and in no acute distress. Eyes: Conjunctivae are normal.  EOMI.  PERRLA. Head: Atraumatic. Nose: No congestion/rhinnorhea. Mouth/Throat: Mucous membranes are slightly dry.   Neck: Normal range of motion.  Cardiovascular: Normal rate, regular rhythm. Good peripheral circulation. Respiratory: Normal respiratory effort.  No retractions. Gastrointestinal: No distention.  Musculoskeletal: Extremities warm and well perfused.  Neurologic:  Normal speech and language.  Motor and sensory intact in all extremities.  Normal coordination with no ataxia  on finger-to-nose.  Cranial nerves III through XII intact.  No gross focal neurologic deficits are appreciated.  Skin:  Skin is warm and dry. No rash noted. Psychiatric: Mood and affect are normal. Speech and behavior are normal.  ____________________________________________   LABS (all labs ordered are listed, but only abnormal results are displayed)  Labs Reviewed  URINALYSIS, COMPLETE (UACMP) WITH MICROSCOPIC - Abnormal; Notable for the following components:      Result Value   Color, Urine STRAW (*)    APPearance CLEAR (*)    Hgb urine dipstick SMALL (*)    Bacteria, UA RARE (*)    All other components within normal limits  BASIC METABOLIC PANEL  CBC  POC URINE PREG, ED  POCT PREGNANCY, URINE  CBG MONITORING, ED   ____________________________________________  EKG  ED ECG REPORT I, Dionne BucySebastian Winfield Caba, the attending physician, personally viewed and interpreted this ECG.  Date: 12/11/2018 EKG Time: 1519 Rate: 77 Rhythm: normal sinus rhythm QRS Axis: normal Intervals: normal ST/T Wave abnormalities: normal Narrative Interpretation: no evidence of acute ischemia  ____________________________________________  RADIOLOGY  CT head: No acute abnormality  ____________________________________________   PROCEDURES  Procedure(s) performed: No  Procedures  Critical Care performed: No ____________________________________________   INITIAL IMPRESSION / ASSESSMENT AND PLAN / ED COURSE  Pertinent labs & imaging results that were available during my care of the patient were reviewed by me and considered in my medical decision making (see chart for details).  23 year old female with no significant past medical history except for menorrhagia presents with a syncopal episode with a brief prodrome of mild dizziness.  She states that her husband noted she was shaking her arm slightly during it, however she awoke quickly and had no postictal phase.  She had no incontinence  and did not bite her tongue.  On exam, the patient is well-appearing.  She had slight tachycardia at triage but otherwise normal vital signs.  The remainder of the exam is unremarkable.  Neuro exam is nonfocal.  Overall presentation is most consistent with vasovagal syncope or other benign etiology of syncope.  EKG is normal and I do not suspect cardiac etiology.  The patient has no signs or symptoms to suggest DVT or PE.  Seizure is also on the differential especially given the minimal prodrome, although less likely given the lack of postictal phase or other findings associated with seizure.  We will obtain basic labs, troponin, and a CT for work-up of possible first-time seizure.  If the work-up is negative I anticipate discharge home with primary care follow-up.  ----------------------------------------- 4:17 PM on 12/11/2018 -----------------------------------------  CT head is negative.  The patient states she feels well after fluids.  She is stable for discharge home at this time.  Return precautions given, and she expresses understanding. ____________________________________________   FINAL CLINICAL IMPRESSION(S) / ED DIAGNOSES  Final diagnoses:  Syncope, unspecified syncope type      NEW  MEDICATIONS STARTED DURING THIS VISIT:  New Prescriptions   No medications on file     Note:  This document was prepared using Dragon voice recognition software and may include unintentional dictation errors.    Dionne Bucy, MD 12/11/18 [phone]

## 2019-04-09 ENCOUNTER — Encounter: Payer: Managed Care, Other (non HMO) | Admitting: Physician Assistant

## 2019-05-17 ENCOUNTER — Encounter: Payer: Self-pay | Admitting: Internal Medicine

## 2019-05-17 ENCOUNTER — Ambulatory Visit (INDEPENDENT_AMBULATORY_CARE_PROVIDER_SITE_OTHER): Payer: BC Managed Care – PPO | Admitting: Internal Medicine

## 2019-05-17 DIAGNOSIS — K5900 Constipation, unspecified: Secondary | ICD-10-CM

## 2019-05-17 DIAGNOSIS — N920 Excessive and frequent menstruation with regular cycle: Secondary | ICD-10-CM | POA: Diagnosis not present

## 2019-05-17 DIAGNOSIS — K649 Unspecified hemorrhoids: Secondary | ICD-10-CM | POA: Diagnosis not present

## 2019-05-17 NOTE — Progress Notes (Signed)
Patient ID: Linda Mendez, female   DOB: July 14, 1996, 23 y.o.   MRN: [id number]   Virtual Visit via video Note  This visit type was conducted due to national recommendations for restrictions regarding the COVID-19 pandemic (e.g. social distancing).  This format is felt to be most appropriate for this patient at this time.  All issues noted in this document were discussed and addressed.  No physical exam was performed (except for noted visual exam findings with Video Visits).   I connected with Linda Mendez by a video enabled telemedicine application and verified that I am speaking with the correct person using two identifiers. Location patient: home Location provider: work Persons participating in the virtual visit: patient, provider  I discussed the limitations, risks, security and privacy concerns of performing an evaluation and management service by video and the availability of in person appointments.  The patient expressed understanding and agreed to proceed.   Reason for visit: establish care.    HPI: Has been seeing Carles Collet. Here to establish care.  She reports she has been healthy.  Works as a Pharmacist, hospital.  Handling stress.  Stays active.  No chest pain.  No sob.  No acid reflux.  No abdominal pain.  Bowels moving.  Previously had mono in college.  Developed hemolytic anemia.  Resolved.  Has done well since.  She does report heavy periods.  She is having heavy periods.  Heavy for 1-2 days.  Lasts for 5 more days.  Some discomfort.  Varying bowels.  Has hemorrhoids.  Taking fiber.  No problems with hemorrhoids now.  Has had persistent issues with her periods and discomfort. States has been told may have endometriosis.  Previously on ocp's, but did not tolerate and desires not to take.  Planning to try and get pregnant within the next year.     ROS: See pertinent positives and negatives per HPI.  Past Medical History:  Diagnosis Date  . Arm fracture, left   . Hemorrhoids   .  Mononucleosis     Past Surgical History:  Procedure Laterality Date  . arm surgery    . FRACTURE SURGERY Left 2003   Left arm     Family History  Problem Relation Age of Onset  . Thyroid disease Mother   . Migraines Mother   . Hypersomnolence Mother   . Hyperlipidemia Father   . Hyperlipidemia Paternal Grandfather     SOCIAL HX: reviewed.   No current outpatient medications on file.  EXAM:  GENERAL: alert, oriented, appears well and in no acute distress  HEENT: atraumatic, conjunttiva clear, no obvious abnormalities on inspection of external nose and ears  NECK: normal movements of the head and neck  LUNGS: on inspection no signs of respiratory distress, breathing rate appears normal, no obvious gross SOB, gasping or wheezing  CV: no obvious cyanosis  PSYCH/NEURO: pleasant and cooperative, no obvious depression or anxiety, speech and thought processing grossly intact  ASSESSMENT AND PLAN:  Discussed the following assessment and plan:  Hemorrhoids No problems now with hemorrhoids.  Follow.    Menorrhagia with regular cycle Heavy periods and discomfort as outlined.  Persistent issue for her.  Refer to gyn for further evaluation.  Not able to take (and desires not to take) ocp's.    Constipation Some constipation.  Her pain and bowel issues seem to occur around her menstrual period.  Continue fiber.  Doing better.  Follow.      I discussed the assessment and treatment plan with the  patient. The patient was provided an opportunity to ask questions and all were answered. The patient agreed with the plan and demonstrated an understanding of the instructions.   The patient was advised to call back or seek an in-person evaluation if the symptoms worsen or if the condition fails to improve as anticipated.   Einar Pheasant, MD

## 2019-05-19 ENCOUNTER — Encounter: Payer: Self-pay | Admitting: Internal Medicine

## 2019-05-19 DIAGNOSIS — K5900 Constipation, unspecified: Secondary | ICD-10-CM | POA: Insufficient documentation

## 2019-05-19 DIAGNOSIS — K649 Unspecified hemorrhoids: Secondary | ICD-10-CM | POA: Insufficient documentation

## 2019-05-19 NOTE — Assessment & Plan Note (Signed)
No problems now with hemorrhoids.  Follow.

## 2019-05-19 NOTE — Assessment & Plan Note (Signed)
Heavy periods and discomfort as outlined.  Persistent issue for her.  Refer to gyn for further evaluation.  Not able to take (and desires not to take) ocp's.

## 2019-05-19 NOTE — Assessment & Plan Note (Signed)
Some constipation.  Her pain and bowel issues seem to occur around her menstrual period.  Continue fiber.  Doing better.  Follow.

## 2019-05-27 ENCOUNTER — Other Ambulatory Visit: Payer: Self-pay

## 2019-05-27 DIAGNOSIS — Z20822 Contact with and (suspected) exposure to covid-19: Secondary | ICD-10-CM

## 2019-05-28 LAB — NOVEL CORONAVIRUS, NAA: SARS-CoV-2, NAA: NOT DETECTED

## 2019-10-09 ENCOUNTER — Ambulatory Visit: Payer: BC Managed Care – PPO | Attending: Internal Medicine

## 2019-10-09 DIAGNOSIS — Z20822 Contact with and (suspected) exposure to covid-19: Secondary | ICD-10-CM

## 2019-10-10 LAB — NOVEL CORONAVIRUS, NAA: SARS-CoV-2, NAA: NOT DETECTED

## 2019-11-22 ENCOUNTER — Ambulatory Visit: Payer: BC Managed Care – PPO | Admitting: Internal Medicine

## 2019-12-02 ENCOUNTER — Encounter: Payer: Self-pay | Admitting: Internal Medicine

## 2019-12-13 ENCOUNTER — Ambulatory Visit: Payer: BC Managed Care – PPO | Admitting: Internal Medicine

## 2020-01-14 ENCOUNTER — Encounter: Payer: Self-pay | Admitting: Internal Medicine

## 2020-01-14 ENCOUNTER — Ambulatory Visit: Payer: BC Managed Care – PPO | Admitting: Internal Medicine

## 2020-01-14 ENCOUNTER — Other Ambulatory Visit: Payer: Self-pay

## 2020-01-14 DIAGNOSIS — R55 Syncope and collapse: Secondary | ICD-10-CM

## 2020-01-14 DIAGNOSIS — N920 Excessive and frequent menstruation with regular cycle: Secondary | ICD-10-CM | POA: Diagnosis not present

## 2020-01-14 NOTE — Progress Notes (Signed)
Patient ID: Linda Mendez, female   DOB: [birth year], 24 y.o.   MRN: [id number]   Subjective:    Patient ID: Linda Mendez, female    DOB: July 03, 1996, 24 y.o.   MRN: [id number]  HPI This visit occurred during the SARS-CoV-2 public health emergency.  Safety protocols were in place, including screening questions prior to the visit, additional usage of staff PPE, and extensive cleaning of exam room while observing appropriate contact time as indicated for disinfecting solutions.  Patient here for a scheduled follow up.  Was seen in ER 12/11/18 with syncopal episode.  Reviewed note.  Felt to be most c/w vasovagal syncope.  EKG - normal.  CT head negative.  Has had no recurring episodes.  Stays active.  No chest pain or sob.  No acid reflux reported.  Eating.  Teaching.  Handling stress. Has seen gyn for heavy bleeding and pelvic pain.  Was prescribed pain medication, muscle relaxer and gabapentin.  Not taking any of these medications. No significant pain currently.  LMP 01/03/20.  Still spotting.  Periods varying.  Will keep menstrual diary.  Trying to get pregnant. Taking pre natal vitamins.     Past Medical History:  Diagnosis Date  . Arm fracture, left   . Hemorrhoids   . Mononucleosis    Past Surgical History:  Procedure Laterality Date  . arm surgery    . FRACTURE SURGERY Left 2003   Left arm    Family History  Problem Relation Age of Onset  . Thyroid disease Mother   . Migraines Mother   . Hypersomnolence Mother   . Hyperlipidemia Father   . Hyperlipidemia Paternal Grandfather    Social History   Socioeconomic History  . Marital status: Married    Spouse name: Not on file  . Number of children: Not on file  . Years of education: Not on file  . Highest education level: Not on file  Occupational History  . Not on file  Tobacco Use  . Smoking status: Never Smoker  . Smokeless tobacco: Never Used  Substance and Sexual Activity  . Alcohol use: Yes    Comment: Occasionally  .  Drug use: No  . Sexual activity: Yes    Partners: Male    Birth control/protection: Condom  Other Topics Concern  . Not on file  Social History Narrative  . Not on file   Social Determinants of Health   Financial Resource Strain:   . Difficulty of Paying Living Expenses:   Food Insecurity:   . Worried About Programme researcher, broadcasting/film/video in the Last Year:   . Barista in the Last Year:   Transportation Needs:   . Freight forwarder (Medical):   Marland Kitchen Lack of Transportation (Non-Medical):   Physical Activity:   . Days of Exercise per Week:   . Minutes of Exercise per Session:   Stress:   . Feeling of Stress :   Social Connections:   . Frequency of Communication with Friends and Family:   . Frequency of Social Gatherings with Friends and Family:   . Attends Religious Services:   . Active Member of Clubs or Organizations:   . Attends Banker Meetings:   Marland Kitchen Marital Status:     No outpatient encounter medications on file as of 01/14/2020.   No facility-administered encounter medications on file as of 01/14/2020.   Review of Systems  Constitutional: Negative for appetite change and unexpected weight change.  HENT: Negative for congestion and  sinus pressure.   Respiratory: Negative for cough, chest tightness and shortness of breath.   Cardiovascular: Negative for chest pain, palpitations and leg swelling.  Gastrointestinal: Negative for abdominal pain, diarrhea, nausea and vomiting.  Genitourinary: Negative for difficulty urinating and dysuria.  Musculoskeletal: Negative for joint swelling and myalgias.  Skin: Negative for color change and rash.  Neurological: Negative for dizziness, light-headedness and headaches.  Psychiatric/Behavioral: Negative for agitation and dysphoric mood.       Objective:    Physical Exam Vitals reviewed.  Constitutional:      General: She is not in acute distress.    Appearance: Normal appearance.  HENT:     Head: Normocephalic and  atraumatic.     Right Ear: External ear normal.     Left Ear: External ear normal.  Eyes:     General: No scleral icterus.       Right eye: No discharge.        Left eye: No discharge.     Conjunctiva/sclera: Conjunctivae normal.  Neck:     Thyroid: No thyromegaly.  Cardiovascular:     Rate and Rhythm: Normal rate and regular rhythm.  Pulmonary:     Effort: No respiratory distress.     Breath sounds: Normal breath sounds. No wheezing.  Abdominal:     General: Bowel sounds are normal.     Palpations: Abdomen is soft.     Tenderness: There is no abdominal tenderness.  Musculoskeletal:        General: No swelling or tenderness.     Cervical back: Neck supple. No tenderness.  Lymphadenopathy:     Cervical: No cervical adenopathy.  Skin:    Findings: No erythema or rash.  Neurological:     Mental Status: She is alert.  Psychiatric:        Mood and Affect: Mood normal.        Behavior: Behavior normal.     BP 100/66   Pulse 77   Temp 97.6 F (36.4 C)   Resp 16   Ht 5\' 3"  (1.6 m)   Wt 110 lb (49.9 kg)   SpO2 99%   BMI 19.49 kg/m  Wt Readings from Last 3 Encounters:  01/14/20 110 lb (49.9 kg)  05/17/19 110 lb (49.9 kg)  12/11/18 115 lb (52.2 kg)     Lab Results  Component Value Date   WBC 4.6 12/11/2018   HGB 13.6 12/11/2018   HCT 39.9 12/11/2018   PLT 280 12/11/2018   GLUCOSE 96 12/11/2018   CHOL 134 09/12/2017   TRIG 49 09/12/2017   HDL 55 09/12/2017   LDLCALC 69 09/12/2017   ALT 21 09/12/2017   AST 22 09/12/2017   NA 139 12/11/2018   K 3.5 12/11/2018   CL 105 12/11/2018   CREATININE 0.56 12/11/2018   BUN 16 12/11/2018   CO2 25 12/11/2018   TSH 2.310 09/12/2017    CT Head Wo Contrast  Result Date: 12/11/2018 CLINICAL DATA:  Syncope with apparent seizure EXAM: CT HEAD WITHOUT CONTRAST TECHNIQUE: Contiguous axial images were obtained from the base of the skull through the vertex without intravenous contrast. COMPARISON:  None. FINDINGS: Brain: The  ventricles are normal in size and configuration. There is no intracranial mass, hemorrhage, extra-axial fluid collection, or midline shift. Brain parenchyma appears unremarkable. No evident acute infarct. Vascular: No hyperdense vessel.  No evident vascular calcification. Skull: Bony calvarium appears intact. Sinuses/Orbits: Visualized paranasal sinuses are clear. Visualized orbits appear symmetric bilaterally. Other: Mastoid air  cells are clear. IMPRESSION: Study within normal limits. Electronically Signed   By: Bretta Bang III M.D.   On: 12/11/2018 15:37       Assessment & Plan:   Problem List Items Addressed This Visit    Menorrhagia with regular cycle    Saw Dr Dalbert Garnet.  On no medication now.  Periods as outlined.  Will keep menstrual diary.  Trying to get pregnant.  Continue pre natal vitamins.        Syncope    Previous episode as outlined.  W/up unrevealing.  No further episodes.  Follow.            Dale Homeworth, MD

## 2020-01-19 ENCOUNTER — Encounter: Payer: Self-pay | Admitting: Internal Medicine

## 2020-01-19 DIAGNOSIS — R55 Syncope and collapse: Secondary | ICD-10-CM | POA: Insufficient documentation

## 2020-01-19 NOTE — Assessment & Plan Note (Signed)
Saw Dr Dalbert Garnet.  On no medication now.  Periods as outlined.  Will keep menstrual diary.  Trying to get pregnant.  Continue pre natal vitamins.

## 2020-01-19 NOTE — Assessment & Plan Note (Signed)
Previous episode as outlined.  W/up unrevealing.  No further episodes.  Follow.

## 2020-02-02 ENCOUNTER — Encounter: Payer: Self-pay | Admitting: Internal Medicine

## 2020-02-03 NOTE — Telephone Encounter (Signed)
She can come in for a blood pregnancy test.  I also recommend going ahead and scheduling an appt with Dr Dalbert Garnet.  If we need to schedule this let me know.  Make sure taking prenatal vitamins.  Regarding the appt with gyn - varies.  May just examine and talk with her for first appt.

## 2020-02-05 ENCOUNTER — Ambulatory Visit: Payer: BC Managed Care – PPO | Admitting: Internal Medicine

## 2020-02-05 ENCOUNTER — Other Ambulatory Visit: Payer: Self-pay

## 2020-02-05 ENCOUNTER — Encounter: Payer: Self-pay | Admitting: Internal Medicine

## 2020-02-05 DIAGNOSIS — N926 Irregular menstruation, unspecified: Secondary | ICD-10-CM

## 2020-02-05 LAB — CBC WITH DIFFERENTIAL/PLATELET
Basophils Absolute: 0 10*3/uL (ref 0.0–0.1)
Basophils Relative: 0.6 % (ref 0.0–3.0)
Eosinophils Absolute: 0 10*3/uL (ref 0.0–0.7)
Eosinophils Relative: 0.4 % (ref 0.0–5.0)
HCT: 35.3 % — ABNORMAL LOW (ref 36.0–46.0)
Hemoglobin: 12.3 g/dL (ref 12.0–15.0)
Lymphocytes Relative: 34.2 % (ref 12.0–46.0)
Lymphs Abs: 1.6 10*3/uL (ref 0.7–4.0)
MCHC: 34.7 g/dL (ref 30.0–36.0)
MCV: 90.9 fl (ref 78.0–100.0)
Monocytes Absolute: 0.5 10*3/uL (ref 0.1–1.0)
Monocytes Relative: 10.7 % (ref 3.0–12.0)
Neutro Abs: 2.5 10*3/uL (ref 1.4–7.7)
Neutrophils Relative %: 54.1 % (ref 43.0–77.0)
Platelets: 210 10*3/uL (ref 150.0–400.0)
RBC: 3.89 Mil/uL (ref 3.87–5.11)
RDW: 12.6 % (ref 11.5–15.5)
WBC: 4.6 10*3/uL (ref 4.0–10.5)

## 2020-02-05 LAB — FERRITIN: Ferritin: 18.5 ng/mL (ref 10.0–291.0)

## 2020-02-05 LAB — HCG, QUANTITATIVE, PREGNANCY: Quantitative HCG: 258.92 m[IU]/mL

## 2020-02-05 NOTE — Progress Notes (Signed)
Patient ID: Linda Mendez, female   DOB: [birth year], 24 y.o.   MRN: [id number]   Subjective:    Patient ID: Linda Mendez, female    DOB: 02-01-1996, 24 y.o.   MRN: [id number]  HPI This visit occurred during the SARS-CoV-2 public health emergency.  Safety protocols were in place, including screening questions prior to the visit, additional usage of staff PPE, and extensive cleaning of exam room while observing appropriate contact time as indicated for disinfecting solutions.  Patient here as a work in appt.  She is accompanied by her husband.  History obtained from both of them.  Work in - home urine pregnancy test positive.  States last menstrual period 01/03/20.  Spotting two weeks ago.  Eating. No increased nausea or vomiting.  Minimal nausea.  No abdominal pain or cramping.  Bowels moving. Taking pre natal vitamins.  Home urine pregnancy test positive. Took two.  No vaginal problems.  Family history of gestational diabetes.    Past Medical History:  Diagnosis Date  . Arm fracture, left   . Hemorrhoids   . Mononucleosis    Past Surgical History:  Procedure Laterality Date  . arm surgery    . FRACTURE SURGERY Left 2003   Left arm    Family History  Problem Relation Age of Onset  . Thyroid disease Mother   . Migraines Mother   . Hypersomnolence Mother   . Hyperlipidemia Father   . Hyperlipidemia Paternal Grandfather    Social History   Socioeconomic History  . Marital status: Married    Spouse name: Not on file  . Number of children: Not on file  . Years of education: Not on file  . Highest education level: Not on file  Occupational History  . Not on file  Tobacco Use  . Smoking status: Never Smoker  . Smokeless tobacco: Never Used  Vaping Use  . Vaping Use: Never used  Substance and Sexual Activity  . Alcohol use: Yes    Comment: Occasionally  . Drug use: No  . Sexual activity: Yes    Partners: Male    Birth control/protection: Condom  Other Topics Concern  . Not  on file  Social History Narrative  . Not on file   Social Determinants of Health   Financial Resource Strain:   . Difficulty of Paying Living Expenses:   Food Insecurity:   . Worried About Programme researcher, broadcasting/film/video in the Last Year:   . Barista in the Last Year:   Transportation Needs:   . Freight forwarder (Medical):   Marland Kitchen Lack of Transportation (Non-Medical):   Physical Activity:   . Days of Exercise per Week:   . Minutes of Exercise per Session:   Stress:   . Feeling of Stress :   Social Connections:   . Frequency of Communication with Friends and Family:   . Frequency of Social Gatherings with Friends and Family:   . Attends Religious Services:   . Active Member of Clubs or Organizations:   . Attends Banker Meetings:   Marland Kitchen Marital Status:     Outpatient Encounter Medications as of 02/05/2020  Medication Sig  . Prenatal Vit-Fe Fumarate-FA (PRENATAL VITAMIN PO) Take by mouth daily.   No facility-administered encounter medications on file as of 02/05/2020.    Review of Systems  Constitutional: Negative for appetite change and unexpected weight change.  HENT: Negative for congestion and sinus pressure.   Respiratory: Negative for cough, chest tightness and  shortness of breath.   Cardiovascular: Negative for chest pain, palpitations and leg swelling.  Gastrointestinal: Negative for abdominal pain, diarrhea and vomiting.       Minimal nausea.   Genitourinary: Negative for difficulty urinating and dysuria.  Musculoskeletal: Negative for joint swelling and myalgias.  Skin: Negative for color change and rash.  Neurological: Negative for dizziness, light-headedness and headaches.  Psychiatric/Behavioral: Negative for agitation and dysphoric mood.       Objective:    Physical Exam Constitutional:      General: She is not in acute distress.    Appearance: Normal appearance.  HENT:     Head: Normocephalic and atraumatic.     Right Ear: External ear  normal.     Left Ear: External ear normal.  Eyes:     General:        Right eye: No discharge.        Left eye: No discharge.     Conjunctiva/sclera: Conjunctivae normal.  Neck:     Thyroid: No thyromegaly.  Cardiovascular:     Rate and Rhythm: Normal rate and regular rhythm.  Pulmonary:     Effort: No respiratory distress.     Breath sounds: Normal breath sounds. No wheezing.  Abdominal:     General: Bowel sounds are normal.     Palpations: Abdomen is soft.     Tenderness: There is no abdominal tenderness.  Musculoskeletal:        General: No swelling or tenderness.     Cervical back: Neck supple. No tenderness.  Lymphadenopathy:     Cervical: No cervical adenopathy.  Skin:    Findings: No erythema or rash.  Neurological:     Mental Status: She is alert.  Psychiatric:        Mood and Affect: Mood normal.        Behavior: Behavior normal.     BP 110/70   Pulse 98   Temp 97.6 F (36.4 C)   Resp 16   Ht 5\' 3"  (1.6 m)   Wt 119 lb (54 kg)   SpO2 99%   BMI 21.08 kg/m  Wt Readings from Last 3 Encounters:  02/05/20 119 lb (54 kg)  01/14/20 110 lb (49.9 kg)  05/17/19 110 lb (49.9 kg)     Lab Results  Component Value Date   WBC 4.6 02/05/2020   HGB 12.3 02/05/2020   HCT 35.3 (L) 02/05/2020   PLT 210.0 02/05/2020   GLUCOSE 96 12/11/2018   CHOL 134 09/12/2017   TRIG 49 09/12/2017   HDL 55 09/12/2017   LDLCALC 69 09/12/2017   ALT 21 09/12/2017   AST 22 09/12/2017   NA 139 12/11/2018   K 3.5 12/11/2018   CL 105 12/11/2018   CREATININE 0.56 12/11/2018   BUN 16 12/11/2018   CO2 25 12/11/2018   TSH 2.310 09/12/2017    CT Head Wo Contrast  Result Date: 12/11/2018 CLINICAL DATA:  Syncope with apparent seizure EXAM: CT HEAD WITHOUT CONTRAST TECHNIQUE: Contiguous axial images were obtained from the base of the skull through the vertex without intravenous contrast. COMPARISON:  None. FINDINGS: Brain: The ventricles are normal in size and configuration. There is no  intracranial mass, hemorrhage, extra-axial fluid collection, or midline shift. Brain parenchyma appears unremarkable. No evident acute infarct. Vascular: No hyperdense vessel.  No evident vascular calcification. Skull: Bony calvarium appears intact. Sinuses/Orbits: Visualized paranasal sinuses are clear. Visualized orbits appear symmetric bilaterally. Other: Mastoid air cells are clear. IMPRESSION: Study within normal  limits. Electronically Signed   By: Bretta Bang III M.D.   On: 12/11/2018 15:37       Assessment & Plan:   Problem List Items Addressed This Visit    Menstrual changes    Last menstrual period 01/03/20.  Home urine pregnancy test positive.  Discussed with her and her husband.  Question answered.  Check serum pregnancy test.  Continue pre natal vitamins.  Will need referral to gyn.  Wants to establish with midwife.        Relevant Orders   B-HCG Quant (Completed)   CBC with Differential/Platelet (Completed)   Ferritin (Completed)       Dale Altamont, MD

## 2020-02-09 ENCOUNTER — Encounter: Payer: Self-pay | Admitting: Internal Medicine

## 2020-02-09 NOTE — Assessment & Plan Note (Addendum)
Last menstrual period 01/03/20.  Home urine pregnancy test positive.  Discussed with her and her husband.  Question answered.  Check serum pregnancy test.  Continue pre natal vitamins.  Will need referral to gyn.  Wants to establish with midwife.

## 2020-02-12 ENCOUNTER — Other Ambulatory Visit: Payer: Self-pay | Admitting: Internal Medicine

## 2020-02-12 DIAGNOSIS — Z3A01 Less than 8 weeks gestation of pregnancy: Secondary | ICD-10-CM

## 2020-02-12 NOTE — Progress Notes (Signed)
Order placed for referral to OB 

## 2020-02-16 IMAGING — CT CT HEAD WITHOUT CONTRAST
3 series · 15 of 47 positions shown, 18 images · non-contrast
Comparison: None.

CLINICAL DATA: Syncope with apparent seizure

EXAM:
CT HEAD WITHOUT CONTRAST
TECHNIQUE: Contiguous axial images were obtained from the base of the skull
through the vertex without intravenous contrast.

[Series 2: head wo · axial · 0.47mm/px · z∈[-124,+1]mm · 9 of 31 slices shown, 12 images]
[im 3/31  brain]
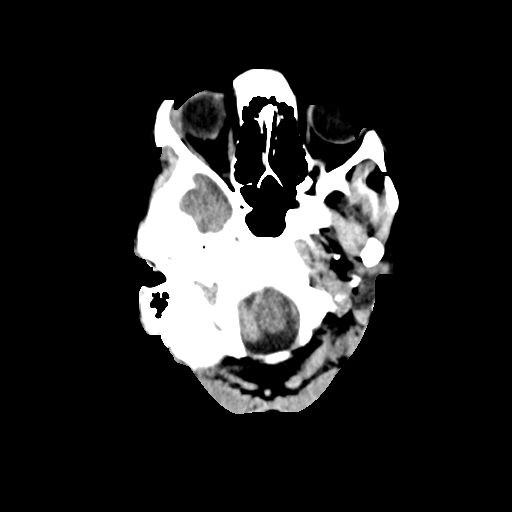
[im 3/31  bone]
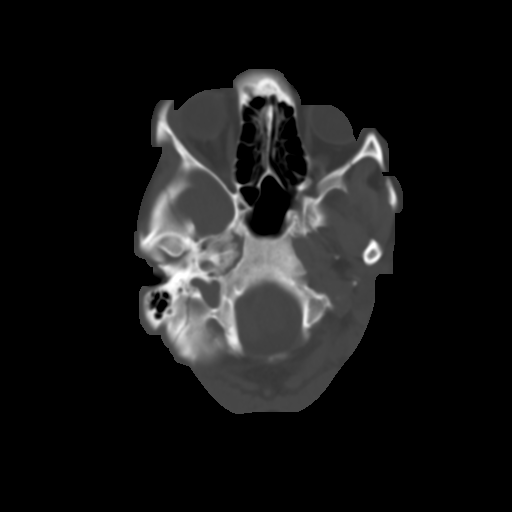
[im 6/31  brain]
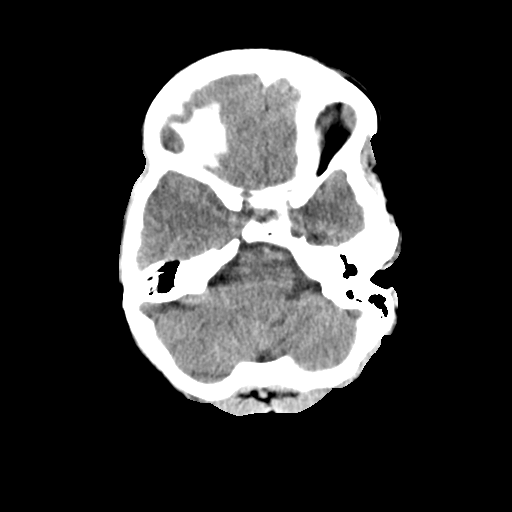
[im 9/31  brain]
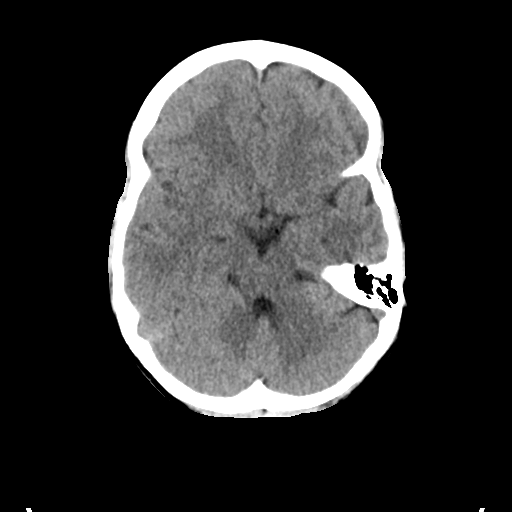
[im 12/31  brain]
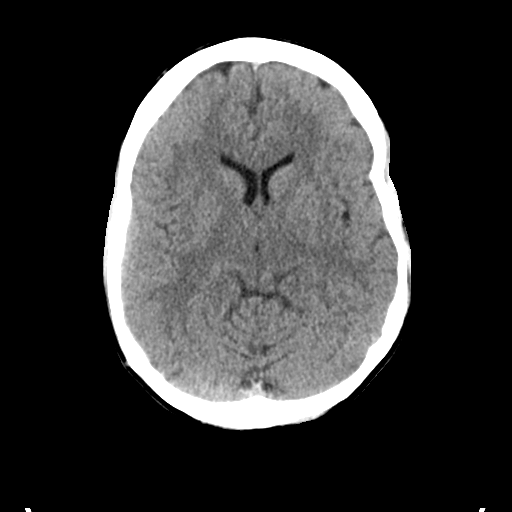
[im 16/31  brain]
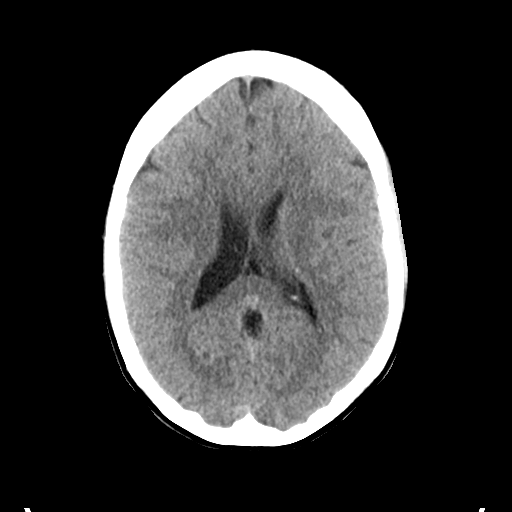
[im 16/31  bone]
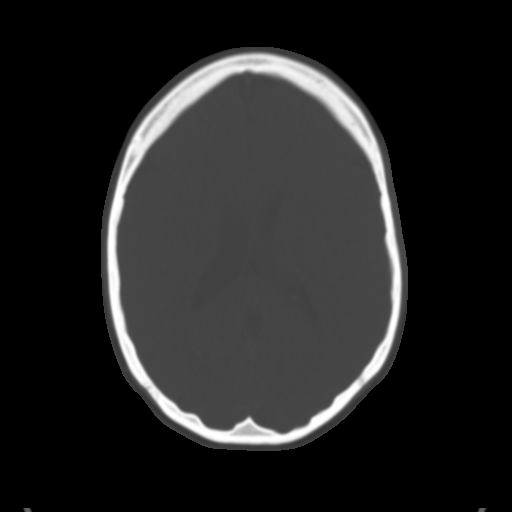
[im 19/31  brain]
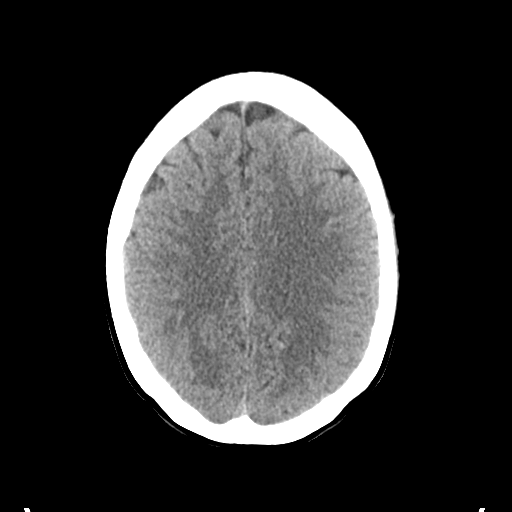
[im 22/31  brain]
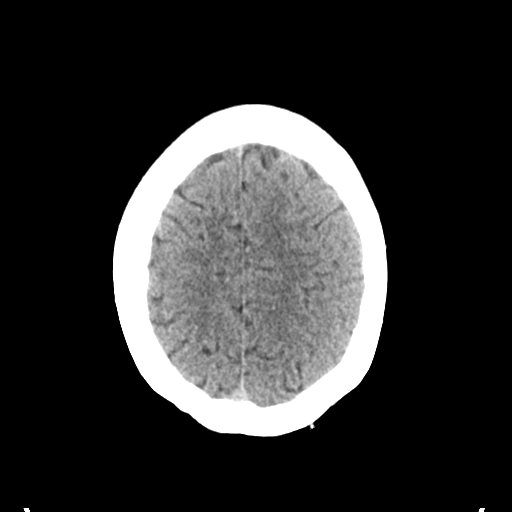
[im 25/31  brain]
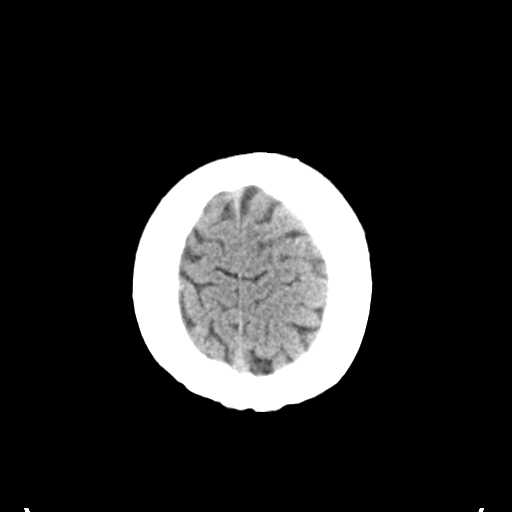
[im 28/31  brain]
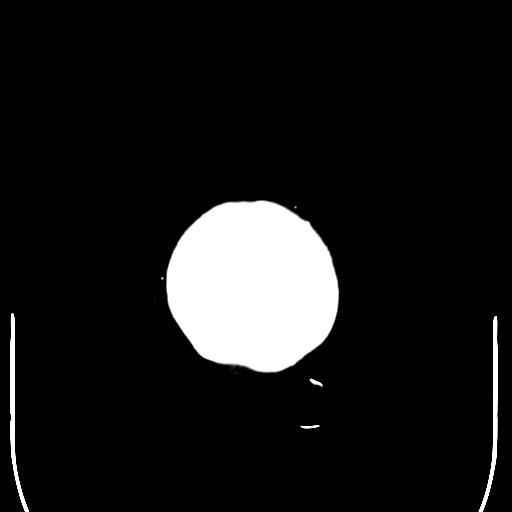
[im 28/31  bone]
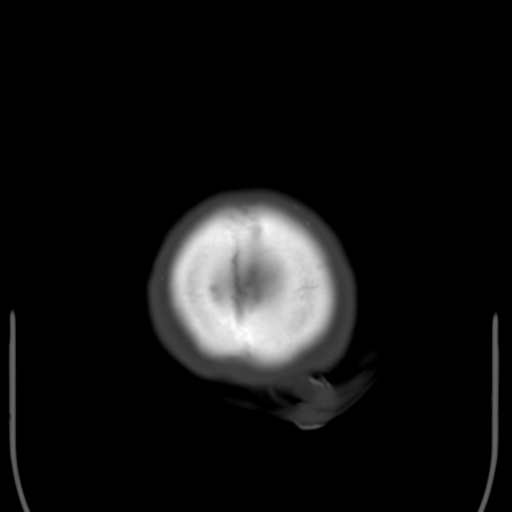

[Series 4: coronal soft tissue · coronal · 0.29mm/px · 3 of 61 slices shown]
[im 21/61  brain]
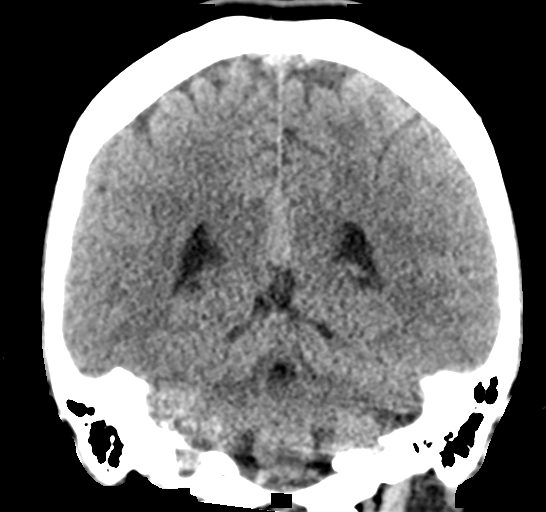
[im 27/61  brain]
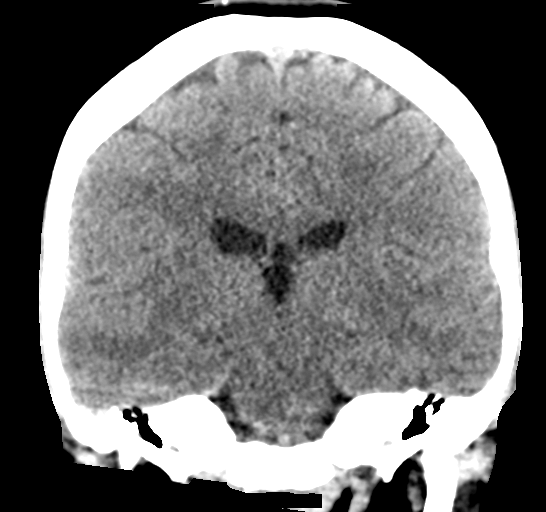
[im 34/61  brain]
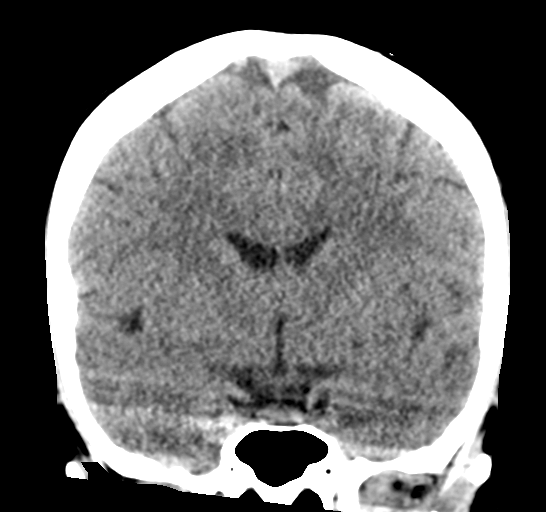

[Series 5: sagittal soft tissue · sagittal · 0.30mm/px · 3 of 50 slices shown]
[im 17/50  brain]
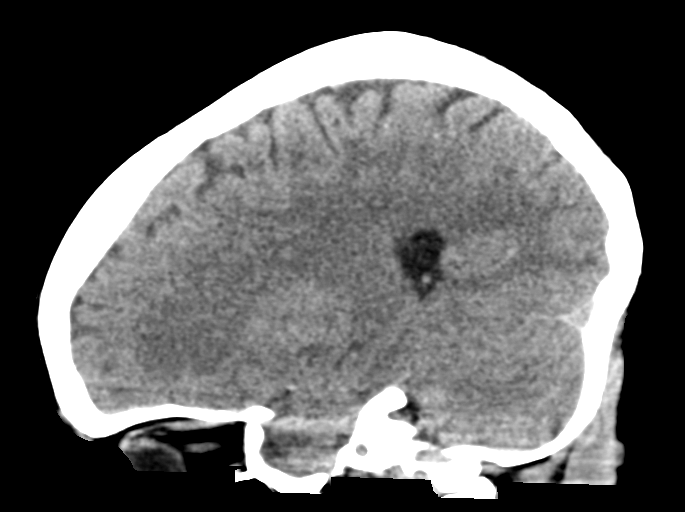
[im 25/50  brain]
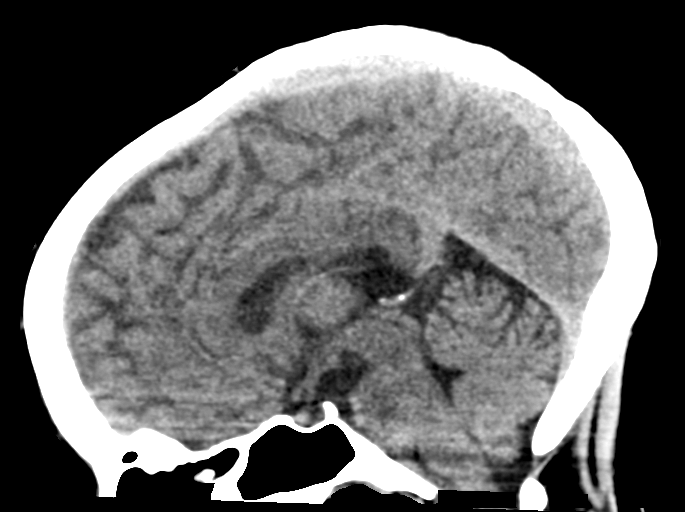
[im 33/50  brain]
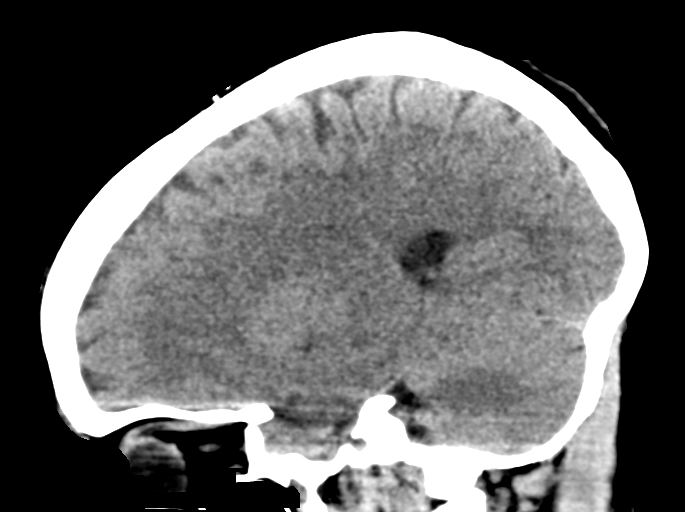

[15 of 47 positions shown; findings below may reference images not displayed]

FINDINGS: Brain: The ventricles are normal in size and configuration. There is
no intracranial mass, hemorrhage, extra-axial fluid collection, or
midline shift. Brain parenchyma appears unremarkable. No evident
acute infarct.

Vascular: No hyperdense vessel.  No evident vascular calcification.

Skull: Bony calvarium appears intact.

Sinuses/Orbits: Visualized paranasal sinuses are clear. Visualized
orbits appear symmetric bilaterally.

Other: Mastoid air cells are clear.
IMPRESSION: Study within normal limits.

## 2020-03-06 ENCOUNTER — Other Ambulatory Visit: Payer: Self-pay

## 2020-03-06 ENCOUNTER — Ambulatory Visit (INDEPENDENT_AMBULATORY_CARE_PROVIDER_SITE_OTHER): Payer: BC Managed Care – PPO

## 2020-03-06 VITALS — BP 118/77 | HR 85 | Ht 63.0 in | Wt 117.9 lb

## 2020-03-06 DIAGNOSIS — Z3401 Encounter for supervision of normal first pregnancy, first trimester: Secondary | ICD-10-CM | POA: Diagnosis not present

## 2020-03-06 DIAGNOSIS — Z0283 Encounter for blood-alcohol and blood-drug test: Secondary | ICD-10-CM | POA: Diagnosis not present

## 2020-03-06 DIAGNOSIS — Z113 Encounter for screening for infections with a predominantly sexual mode of transmission: Secondary | ICD-10-CM

## 2020-03-06 NOTE — Progress Notes (Signed)
      Linda Mendez presents for NOB nurse intake visit. Pregnancy confirmation done at [facility], 01/14/2020, with Dale Harrell, MD.  [name].  P0.  LMP 01/03/2020.  EDD 10/09/2020.  [place] [redacted]w[redacted]d. Pregnancy education material explained and given.  1 cats in the home.  NOB labs ordered. BMI less than 30. TSH/HbgA1c not ordered. Sickle cell not order due to race. HIV and drug screen explained and ordered. Genetic screening discussed. Genetic testing; Unsure. Pt to discuss genetic testing with provider. PNV encouraged. Pt to follow up with provider in 3 weeks for NOB physical and 1 week for U/S for dating and viability. FMLA and [employer] Financial Policy review and signed by pt.   Pt requested to see midwives; pt is interested in having a water birth.

## 2020-03-06 NOTE — Patient Instructions (Signed)
WHAT OB PATIENTS CAN EXPECT   Confirmation of pregnancy and ultrasound ordered if medically indicated-[redacted] weeks gestation  New OB (NOB) intake with nurse and New OB (NOB) labs- [redacted] weeks gestation  New OB (NOB) physical examination with provider- 11/[redacted] weeks gestation  Flu vaccine-[redacted] weeks gestation  Anatomy scan-[redacted] weeks gestation  Glucose tolerance test, blood work to test for anemia, T-dap vaccine-[redacted] weeks gestation  Vaginal swabs/cultures-STD/Group B strep-[redacted] weeks gestation  Appointments every 4 weeks until 28 weeks  Every 2 weeks from 28 weeks until 36 weeks  Weekly visits from 36 weeks until delivery  Second Trimester of Pregnancy  The second trimester is from week 14 through week 27 (month 4 through 6). This is often the time in pregnancy that you feel your best. Often times, morning sickness has lessened or quit. You may have more energy, and you may get hungry more often. Your unborn baby is growing rapidly. At the end of the sixth month, he or she is about 9 inches long and weighs about 1 pounds. You will likely feel the baby move between 18 and 20 weeks of pregnancy. Follow these instructions at home: Medicines  Take over-the-counter and prescription medicines only as told by your doctor. Some medicines are safe and some medicines are not safe during pregnancy.  Take a prenatal vitamin that contains at least 600 micrograms (mcg) of folic acid.  If you have trouble pooping (constipation), take medicine that will make your stool soft (stool softener) if your doctor approves. Eating and drinking   Eat regular, healthy meals.  Avoid raw meat and uncooked cheese.  If you get low calcium from the food you eat, talk to your doctor about taking a daily calcium supplement.  Avoid foods that are high in fat and sugars, such as fried and sweet foods.  If you feel sick to your stomach (nauseous) or throw up (vomit): ? Eat 4 or 5 small meals a day instead of 3 large  meals. ? Try eating a few soda crackers. ? Drink liquids between meals instead of during meals.  To prevent constipation: ? Eat foods that are high in fiber, like fresh fruits and vegetables, whole grains, and beans. ? Drink enough fluids to keep your pee (urine) clear or pale yellow. Activity  Exercise only as told by your doctor. Stop exercising if you start to have cramps.  Do not exercise if it is too hot, too humid, or if you are in a place of great height (high altitude).  Avoid heavy lifting.  Wear low-heeled shoes. Sit and stand up straight.  You can continue to have sex unless your doctor tells you not to. Relieving pain and discomfort  Wear a good support bra if your breasts are tender.  Take warm water baths (sitz baths) to soothe pain or discomfort caused by hemorrhoids. Use hemorrhoid cream if your doctor approves.  Rest with your legs raised if you have leg cramps or low back pain.  If you develop puffy, bulging veins (varicose veins) in your legs: ? Wear support hose or compression stockings as told by your doctor. ? Raise (elevate) your feet for 15 minutes, 3-4 times a day. ? Limit salt in your food. Prenatal care  Write down your questions. Take them to your prenatal visits.  Keep all your prenatal visits as told by your doctor. This is important. Safety  Wear your seat belt when driving.  Make a list of emergency phone numbers, including numbers for family, friends, the   hospital, and police and fire departments. General instructions  Ask your doctor about the right foods to eat or for help finding a counselor, if you need these services.  Ask your doctor about local prenatal classes. Begin classes before month 6 of your pregnancy.  Do not use hot tubs, steam rooms, or saunas.  Do not douche or use tampons or scented sanitary pads.  Do not cross your legs for long periods of time.  Visit your dentist if you have not done so. Use a soft toothbrush  to brush your teeth. Floss gently.  Avoid all smoking, herbs, and alcohol. Avoid drugs that are not approved by your doctor.  Do not use any products that contain nicotine or tobacco, such as cigarettes and e-cigarettes. If you need help quitting, ask your doctor.  Avoid cat litter boxes and soil used by cats. These carry germs that can cause birth defects in the baby and can cause a loss of your baby (miscarriage) or stillbirth. Contact a doctor if:  You have mild cramps or pressure in your lower belly.  You have pain when you pee (urinate).  You have bad smelling fluid coming from your vagina.  You continue to feel sick to your stomach (nauseous), throw up (vomit), or have watery poop (diarrhea).  You have a nagging pain in your belly area.  You feel dizzy. Get help right away if:  You have a fever.  You are leaking fluid from your vagina.  You have spotting or bleeding from your vagina.  You have severe belly cramping or pain.  You lose or gain weight rapidly.  You have trouble catching your breath and have chest pain.  You notice sudden or extreme puffiness (swelling) of your face, hands, ankles, feet, or legs.  You have not felt the baby move in over an hour.  You have severe headaches that do not go away when you take medicine.  You have trouble seeing. Summary  The second trimester is from week 14 through week 27 (months 4 through 6). This is often the time in pregnancy that you feel your best.  To take care of yourself and your unborn baby, you will need to eat healthy meals, take medicines only if your doctor tells you to do so, and do activities that are safe for you and your baby.  Call your doctor if you get sick or if you notice anything unusual about your pregnancy. Also, call your doctor if you need help with the right food to eat, or if you want to know what activities are safe for you. This information is not intended to replace advice given to you by  your health care provider. Make sure you discuss any questions you have with your health care provider. Document Revised: 11/23/2018 Document Reviewed: 09/06/2016 Elsevier Patient Education  2020 [employer] Morning Sickness  Morning sickness is when you feel sick to your stomach (nauseous) during pregnancy. You may feel sick to your stomach and throw up (vomit). You may feel sick in the morning, but you can feel this way at any time of day. Some women feel very sick to their stomach and cannot stop throwing up (hyperemesis gravidarum). Follow these instructions at home: Medicines  Take over-the-counter and prescription medicines only as told by your doctor. Do not take any medicines until you talk with your doctor about them first.  Taking multivitamins before getting pregnant can stop or lessen the harshness of morning sickness. Eating and drinking  Eat   dry toast or crackers before getting out of bed.  Eat 5 or 6 small meals a day.  Eat dry and bland foods like rice and baked potatoes.  Do not eat greasy, fatty, or spicy foods.  Have someone cook for you if the smell of food causes you to feel sick or throw up.  If you feel sick to your stomach after taking prenatal vitamins, take them at night or with a snack.  Eat protein when you need a snack. Nuts, yogurt, and cheese are good choices.  Drink fluids throughout the day.  Try ginger ale made with real ginger, ginger tea made from fresh grated ginger, or ginger candies. General instructions  Do not use any products that have nicotine or tobacco in them, such as cigarettes and e-cigarettes. If you need help quitting, ask your doctor.  Use an air purifier to keep the air in your house free of smells.  Get lots of fresh air.  Try to avoid smells that make you feel sick.  Try: ? Wearing a bracelet that is used for seasickness (acupressure wristband). ? Going to a doctor who puts thin needles into certain body points  (acupuncture) to improve how you feel. Contact a doctor if:  You need medicine to feel better.  You feel dizzy or light-headed.  You are losing weight. Get help right away if:  You feel very sick to your stomach and cannot stop throwing up.  You pass out (faint).  You have very bad pain in your belly. Summary  Morning sickness is when you feel sick to your stomach (nauseous) during pregnancy.  You may feel sick in the morning, but you can feel this way at any time of day.  Making some changes to what you eat may help your symptoms go away. This information is not intended to replace advice given to you by your health care provider. Make sure you discuss any questions you have with your health care provider. Document Revised: 07/14/2017 Document Reviewed: 09/01/2016 [employer] Patient Education  2020 [employer] How a Baby Grows During Pregnancy  Pregnancy begins when a female's sperm enters a female's egg (fertilization). Fertilization usually happens in one of the tubes (fallopian tubes) that connect the ovaries to the womb (uterus). The fertilized egg moves down the fallopian tube to the uterus. Once it reaches the uterus, it implants into the lining of the uterus and begins to grow. For the first 10 weeks, the fertilized egg is called an embryo. After 10 weeks, it is called a fetus. As the fetus continues to grow, it receives oxygen and nutrients through tissue (placenta) that grows to support the developing baby. The placenta is the life support system for the baby. It provides oxygen and nutrition and removes waste. Learning as much as you can about your pregnancy and how your baby is developing can help you enjoy the experience. It can also make you aware of when there might be a problem and when to ask questions. How long does a typical pregnancy last? A pregnancy usually lasts 280 days, or about 40 weeks. Pregnancy is divided into three periods of growth, also called  trimesters:  First trimester: 0-12 weeks.  Second trimester: 13-27 weeks.  Third trimester: 28-40 weeks. The day when your baby is ready to be born (full term) is your estimated date of delivery. How does my baby develop month by month? First month  The fertilized egg attaches to the inside of the uterus.  Some cells   will form the placenta. Others will form the fetus.  The arms, legs, brain, spinal cord, lungs, and heart begin to develop.  At the end of the first month, the heart begins to beat. Second month  The bones, inner ear, eyelids, hands, and feet form.  The genitals develop.  By the end of 8 weeks, all major organs are developing. Third month  All of the internal organs are forming.  Teeth develop below the gums.  Bones and muscles begin to grow. The spine can flex.  The skin is transparent.  Fingernails and toenails begin to form.  Arms and legs continue to grow longer, and hands and feet develop.  The fetus is about 3 inches (7.6 cm) long. Fourth month  The placenta is completely formed.  The external sex organs, neck, outer ear, eyebrows, eyelids, and fingernails are formed.  The fetus can hear, swallow, and move its arms and legs.  The kidneys begin to produce urine.  The skin is covered with a white, waxy coating (vernix) and very fine hair (lanugo). Fifth month  The fetus moves around more and can be felt for the first time (quickening).  The fetus starts to sleep and wake up and may begin to suck its finger.  The nails grow to the end of the fingers.  The organ in the digestive system that makes bile (gallbladder) functions and helps to digest nutrients.  If your baby is a girl, eggs are present in her ovaries. If your baby is a boy, testicles start to move down into his scrotum. Sixth month  The lungs are formed.  The eyes open. The brain continues to develop.  Your baby has fingerprints and toe prints. Your baby's hair grows  thicker.  At the end of the second trimester, the fetus is about 9 inches (22.9 cm) long. Seventh month  The fetus kicks and stretches.  The eyes are developed enough to sense changes in light.  The hands can make a grasping motion.  The fetus responds to sound. Eighth month  All organs and body systems are fully developed and functioning.  Bones harden, and taste buds develop. The fetus may hiccup.  Certain areas of the brain are still developing. The skull remains soft. Ninth month  The fetus gains about  lb (0.23 kg) each week.  The lungs are fully developed.  Patterns of sleep develop.  The fetus's head typically moves into a head-down position (vertex) in the uterus to prepare for birth.  The fetus weighs 6-9 lb (2.72-4.08 kg) and is 19-20 inches (48.26-50.8 cm) long. What can I do to have a healthy pregnancy and help my baby develop? General instructions  Take prenatal vitamins as directed by your health care provider. These include vitamins such as folic acid, iron, calcium, and vitamin D. They are important for healthy development.  Take medicines only as directed by your health care provider. Read labels and ask a pharmacist or your health care provider whether over-the-counter medicines, supplements, and prescription drugs are safe to take during pregnancy.  Keep all follow-up visits as directed by your health care provider. This is important. Follow-up visits include prenatal care and screening tests. How do I know if my baby is developing well? At each prenatal visit, your health care provider will do several different tests to check on your health and keep track of your baby's development. These include:  Fundal height and position. ? Your health care provider will measure your growing belly from your   pubic bone to the top of the uterus using a tape measure. ? Your health care provider will also feel your belly to determine your baby's  position.  Heartbeat. ? An ultrasound in the first trimester can confirm pregnancy and show a heartbeat, depending on how far along you are. ? Your health care provider will check your baby's heart rate at every prenatal visit.  Second trimester ultrasound. ? This ultrasound checks your baby's development. It also may show your baby's gender. What should I do if I have concerns about my baby's development? Always talk with your health care provider about any concerns that you may have about your pregnancy and your baby. Summary  A pregnancy usually lasts 280 days, or about 40 weeks. Pregnancy is divided into three periods of growth, also called trimesters.  Your health care provider will monitor your baby's growth and development throughout your pregnancy.  Follow your health care provider's recommendations about taking prenatal vitamins and medicines during your pregnancy.  Talk with your health care provider if you have any concerns about your pregnancy or your developing baby. This information is not intended to replace advice given to you by your health care provider. Make sure you discuss any questions you have with your health care provider. Document Revised: 11/22/2018 Document Reviewed: 06/14/2017 Elsevier Patient Education  2020 [employer] First Trimester of Pregnancy  The first trimester of pregnancy is from week 1 until the end of week 13 (months 1 through 3). During this time, your baby will begin to develop inside you. At 6-8 weeks, the eyes and face are formed, and the heartbeat can be seen on ultrasound. At the end of 12 weeks, all the baby's organs are formed. Prenatal care is all the medical care you receive before the birth of your baby. Make sure you get good prenatal care and follow all of your doctor's instructions. Follow these instructions at home: Medicines  Take over-the-counter and prescription medicines only as told by your doctor. Some medicines are safe  and some medicines are not safe during pregnancy.  Take a prenatal vitamin that contains at least 600 micrograms (mcg) of folic acid.  If you have trouble pooping (constipation), take medicine that will make your stool soft (stool softener) if your doctor approves. Eating and drinking   Eat regular, healthy meals.  Your doctor will tell you the amount of weight gain that is right for you.  Avoid raw meat and uncooked cheese.  If you feel sick to your stomach (nauseous) or throw up (vomit): ? Eat 4 or 5 small meals a day instead of 3 large meals. ? Try eating a few soda crackers. ? Drink liquids between meals instead of during meals.  To prevent constipation: ? Eat foods that are high in fiber, like fresh fruits and vegetables, whole grains, and beans. ? Drink enough fluids to keep your pee (urine) clear or pale yellow. Activity  Exercise only as told by your doctor. Stop exercising if you have cramps or pain in your lower belly (abdomen) or low back.  Do not exercise if it is too hot, too humid, or if you are in a place of great height (high altitude).  Try to avoid standing for long periods of time. Move your legs often if you must stand in one place for a long time.  Avoid heavy lifting.  Wear low-heeled shoes. Sit and stand up straight.  You can have sex unless your doctor tells you not to. Relieving pain   and discomfort  Wear a good support bra if your breasts are sore.  Take warm water baths (sitz baths) to soothe pain or discomfort caused by hemorrhoids. Use hemorrhoid cream if your doctor says it is okay.  Rest with your legs raised if you have leg cramps or low back pain.  If you have puffy, bulging veins (varicose veins) in your legs: ? Wear support hose or compression stockings as told by your doctor. ? Raise (elevate) your feet for 15 minutes, 3-4 times a day. ? Limit salt in your food. Prenatal care  Schedule your prenatal visits by the twelfth week of  pregnancy.  Write down your questions. Take them to your prenatal visits.  Keep all your prenatal visits as told by your doctor. This is important. Safety  Wear your seat belt at all times when driving.  Make a list of emergency phone numbers. The list should include numbers for family, friends, the hospital, and police and fire departments. General instructions  Ask your doctor for a referral to a local prenatal class. Begin classes no later than at the start of month 6 of your pregnancy.  Ask for help if you need counseling or if you need help with nutrition. Your doctor can give you advice or tell you where to go for help.  Do not use hot tubs, steam rooms, or saunas.  Do not douche or use tampons or scented sanitary pads.  Do not cross your legs for long periods of time.  Avoid all herbs and alcohol. Avoid drugs that are not approved by your doctor.  Do not use any tobacco products, including cigarettes, chewing tobacco, and electronic cigarettes. If you need help quitting, ask your doctor. You may get counseling or other support to help you quit.  Avoid cat litter boxes and soil used by cats. These carry germs that can cause birth defects in the baby and can cause a loss of your baby (miscarriage) or stillbirth.  Visit your dentist. At home, brush your teeth with a soft toothbrush. Be gentle when you floss. Contact a doctor if:  You are dizzy.  You have mild cramps or pressure in your lower belly.  You have a nagging pain in your belly area.  You continue to feel sick to your stomach, you throw up, or you have watery poop (diarrhea).  You have a bad smelling fluid coming from your vagina.  You have pain when you pee (urinate).  You have increased puffiness (swelling) in your face, hands, legs, or ankles. Get help right away if:  You have a fever.  You are leaking fluid from your vagina.  You have spotting or bleeding from your vagina.  You have very bad belly  cramping or pain.  You gain or lose weight rapidly.  You throw up blood. It may look like coffee grounds.  You are around people who have German measles, fifth disease, or chickenpox.  You have a very bad headache.  You have shortness of breath.  You have any kind of trauma, such as from a fall or a car accident. Summary  The first trimester of pregnancy is from week 1 until the end of week 13 (months 1 through 3).  To take care of yourself and your unborn baby, you will need to eat healthy meals, take medicines only if your doctor tells you to do so, and do activities that are safe for you and your baby.  Keep all follow-up visits as told by your   doctor. This is important as your doctor will have to ensure that your baby is healthy and growing well. This information is not intended to replace advice given to you by your health care provider. Make sure you discuss any questions you have with your health care provider. Document Revised: 11/22/2018 Document Reviewed: 08/09/2016 Elsevier Patient Education  2020 [employer] Commonly Asked Questions During Pregnancy  Cats: A parasite can be excreted in cat feces.  To avoid exposure you need to have another person empty the little box.  If you must empty the litter box you will need to wear gloves.  Wash your hands after handling your cat.  This parasite can also be found in raw or undercooked meat so this should also be avoided.  Colds, Sore Throats, Flu: Please check your medication sheet to see what you can take for symptoms.  If your symptoms are unrelieved by these medications please call the office.  Dental Work: Most any dental work your dentist recommends is permitted.  X-rays should only be taken during the first trimester if absolutely necessary.  Your abdomen should be shielded with a lead apron during all x-rays.  Please notify your provider prior to receiving any x-rays.  Novocaine is fine; gas is not recommended.  If your  dentist requires a note from us prior to dental work please call the office and we will provide one for you.  Exercise: Exercise is an important part of staying healthy during your pregnancy.  You may continue most exercises you were accustomed to prior to pregnancy.  Later in your pregnancy you will most likely notice you have difficulty with activities requiring balance like riding a bicycle.  It is important that you listen to your body and avoid activities that put you at a higher risk of falling.  Adequate rest and staying well hydrated are a must!  If you have questions about the safety of specific activities ask your provider.    Exposure to Children with illness: Try to avoid obvious exposure; report any symptoms to us when noted,  If you have chicken pos, red measles or mumps, you should be immune to these diseases.   Please do not take any vaccines while pregnant unless you have checked with your OB provider.  Fetal Movement: After 28 weeks we recommend you do "kick counts" twice daily.  Lie or sit down in a calm quiet environment and count your baby movements "kicks".  You should feel your baby at least 10 times per hour.  If you have not felt 10 kicks within the first hour get up, walk around and have something sweet to eat or drink then repeat for an additional hour.  If count remains less than 10 per hour notify your provider.  Fumigating: Follow your pest control agent's advice as to how long to stay out of your home.  Ventilate the area well before re-entering.  Hemorrhoids:   Most over-the-counter preparations can be used during pregnancy.  Check your medication to see what is safe to use.  It is important to use a stool softener or fiber in your diet and to drink lots of liquids.  If hemorrhoids seem to be getting worse please call the office.   Hot Tubs:  Hot tubs Jacuzzis and saunas are not recommended while pregnant.  These increase your internal body temperature and should be  avoided.  Intercourse:  Sexual intercourse is safe during pregnancy as long as you are comfortable, unless otherwise advised by   your provider.  Spotting may occur after intercourse; report any bright red bleeding that is heavier than spotting.  Labor:  If you know that you are in labor, please go to the hospital.  If you are unsure, please call the office and let us help you decide what to do.  Lifting, straining, etc:  If your job requires heavy lifting or straining please check with your provider for any limitations.  Generally, you should not lift items heavier than that you can lift simply with your hands and arms (no back muscles)  Painting:  Paint fumes do not harm your pregnancy, but may make you ill and should be avoided if possible.  Latex or water based paints have less odor than oils.  Use adequate ventilation while painting.  Permanents & Hair Color:  Chemicals in hair dyes are not recommended as they cause increase hair dryness which can increase hair loss during pregnancy.  " Highlighting" and permanents are allowed.  Dye may be absorbed differently and permanents may not hold as well during pregnancy.  Sunbathing:  Use a sunscreen, as skin burns easily during pregnancy.  Drink plenty of fluids; avoid over heating.  Tanning Beds:  Because their possible side effects are still unknown, tanning beds are not recommended.  Ultrasound Scans:  Routine ultrasounds are performed at approximately 20 weeks.  You will be able to see your baby's general anatomy an if you would like to know the gender this can usually be determined as well.  If it is questionable when you conceived you may also receive an ultrasound early in your pregnancy for dating purposes.  Otherwise ultrasound exams are not routinely performed unless there is a medical necessity.  Although you can request a scan we ask that you pay for it when conducted because insurance does not cover " patient request" scans.  Work: If your  pregnancy proceeds without complications you may work until your due date, unless your physician or employer advises otherwise.  Round Ligament Pain/Pelvic Discomfort:  Sharp, shooting pains not associated with bleeding are fairly common, usually occurring in the second trimester of pregnancy.  They tend to be worse when standing up or when you remain standing for long periods of time.  These are the result of pressure of certain pelvic ligaments called "round ligaments".  Rest, Tylenol and heat seem to be the most effective relief.  As the womb and fetus grow, they rise out of the pelvis and the discomfort improves.  Please notify the office if your pain seems different than that described.  It may represent a more serious condition.  Common Medications Safe in Pregnancy  Acne:      Constipation:  Benzoyl Peroxide     Colace  Clindamycin      Dulcolax Suppository  Topica Erythromycin     Fibercon  Salicylic Acid      Metamucil         Miralax AVOID:        Senakot   Accutane    Cough:  Retin-A       Cough Drops  Tetracycline      Phenergan w/ Codeine if Rx  Minocycline      Robitussin (Plain & DM)  Antibiotics:     Crabs/Lice:  Ceclor       RID  Cephalosporins    AVOID:  E-Mycins      Kwell  Keflex  Macrobid/Macrodantin   Diarrhea:  Penicillin      Kao-Pectate  Zithromax        Imodium AD         PUSH FLUIDS AVOID:       Cipro     Fever:  Tetracycline      Tylenol (Regular or Extra  Minocycline       Strength)  Levaquin      Extra Strength-Do not          Exceed 8 tabs/24 hrs Caffeine:        <200mg/day (equiv. To 1 cup of coffee or  approx. 3 12 oz sodas)         Gas: Cold/Hayfever:       Gas-X  Benadryl      Mylicon  Claritin       Phazyme  **Claritin-D        Chlor-Trimeton    Headaches:  Dimetapp      ASA-Free Excedrin  Drixoral-Non-Drowsy     Cold Compress  Mucinex (Guaifenasin)     Tylenol (Regular or Extra  Sudafed/Sudafed-12 Hour     Strength)  **Sudafed PE  Pseudoephedrine   Tylenol Cold & Sinus     Vicks Vapor Rub  Zyrtec  **AVOID if Problems With Blood Pressure         Heartburn: Avoid lying down for at least 1 hour after meals  Aciphex      Maalox     Rash:  Milk of Magnesia     Benadryl    Mylanta       1% Hydrocortisone Cream  Pepcid  Pepcid Complete   Sleep Aids:  Prevacid      Ambien   Prilosec       Benadryl  Rolaids       Chamomile Tea  Tums (Limit 4/day)     Unisom         Tylenol PM         Warm milk-add vanilla or  Hemorrhoids:       Sugar for taste  Anusol/Anusol H.C.  (RX: Analapram 2.5%)  Sugar Substitutes:  Hydrocortisone OTC     Ok in moderation  Preparation H      Tucks        Vaseline lotion applied to tissue with wiping    Herpes:     Throat:  Acyclovir      Oragel  Famvir  Valtrex     Vaccines:         Flu Shot Leg Cramps:       *Gardasil  Benadryl      Hepatitis A         Hepatitis B Nasal Spray:       Pneumovax  Saline Nasal Spray     Polio Booster         Tetanus Nausea:       Tuberculosis test or PPD  Vitamin B6 25 mg TID   AVOID:    Dramamine      *Gardasil  Emetrol       Live Poliovirus  Ginger Root 250 mg QID    MMR (measles, mumps &  High Complex Carbs @ Bedtime    rebella)  Sea Bands-Accupressure    Varicella (Chickenpox)  Unisom 1/2 tab TID     *No known complications           If received before Pain:         Known pregnancy;   Darvocet       Resume series after  Lortab        Delivery  Percocet      Yeast:   Tramadol      Femstat  Tylenol 3      Gyne-lotrimin  Ultram       Monistat  Vicodin           MISC:         All Sunscreens           Hair Coloring/highlights          Insect Repellant's          (Including DEET)         Mystic Tans  

## 2020-03-07 LAB — URINALYSIS, ROUTINE W REFLEX MICROSCOPIC
Bilirubin, UA: NEGATIVE
Glucose, UA: NEGATIVE
Ketones, UA: NEGATIVE
Leukocytes,UA: NEGATIVE
Nitrite, UA: NEGATIVE
Protein,UA: NEGATIVE
RBC, UA: NEGATIVE
Specific Gravity, UA: 1.011 (ref 1.005–1.030)
Urobilinogen, Ur: 0.2 mg/dL (ref 0.2–1.0)
pH, UA: 7 (ref 5.0–7.5)

## 2020-03-07 LAB — ABO AND RH: Rh Factor: POSITIVE

## 2020-03-07 LAB — VARICELLA ZOSTER ANTIBODY, IGG: Varicella zoster IgG: 415 index (ref >165)

## 2020-03-07 LAB — RPR: RPR Ser Ql: NONREACTIVE

## 2020-03-07 LAB — HIV ANTIBODY (ROUTINE TESTING W REFLEX): HIV Screen 4th Generation wRfx: NONREACTIVE

## 2020-03-07 LAB — TOXOPLASMA ANTIBODIES- IGG AND  IGM
Toxoplasma Antibody- IgM: <3 AU/mL (ref 0.0–7.9)
Toxoplasma IgG Ratio: <3 IU/mL (ref 0.0–7.1)

## 2020-03-07 LAB — RUBELLA SCREEN: Rubella Antibodies, IGG: 1.78 index (ref >0.99)

## 2020-03-07 LAB — ANTIBODY SCREEN: Antibody Screen: NEGATIVE

## 2020-03-07 LAB — HEPATITIS B SURFACE ANTIGEN: Hepatitis B Surface Ag: NEGATIVE

## 2020-03-08 LAB — NICOTINE SCREEN, URINE: Cotinine Ql Scrn, Ur: NEGATIVE ng/mL

## 2020-03-08 LAB — DRUG PROFILE, UR, 9 DRUGS (LABCORP)
Amphetamines, Urine: NEGATIVE ng/mL
Barbiturate Quant, Ur: NEGATIVE ng/mL
Benzodiazepine Quant, Ur: NEGATIVE ng/mL
Cannabinoid Quant, Ur: NEGATIVE ng/mL
Cocaine (Metab.): NEGATIVE ng/mL
Methadone Screen, Urine: NEGATIVE ng/mL
Opiate Quant, Ur: NEGATIVE ng/mL
PCP Quant, Ur: NEGATIVE ng/mL
Propoxyphene: NEGATIVE ng/mL

## 2020-03-08 LAB — CULTURE, OB URINE

## 2020-03-08 LAB — URINE CULTURE, OB REFLEX: Organism ID, Bacteria: NO GROWTH

## 2020-03-09 LAB — GC/CHLAMYDIA PROBE AMP
Chlamydia trachomatis, NAA: NEGATIVE
Neisseria Gonorrhoeae by PCR: NEGATIVE

## 2020-03-11 ENCOUNTER — Ambulatory Visit (INDEPENDENT_AMBULATORY_CARE_PROVIDER_SITE_OTHER): Payer: BC Managed Care – PPO

## 2020-03-11 DIAGNOSIS — Z3401 Encounter for supervision of normal first pregnancy, first trimester: Secondary | ICD-10-CM | POA: Diagnosis not present

## 2020-03-11 DIAGNOSIS — Z3A09 9 weeks gestation of pregnancy: Secondary | ICD-10-CM | POA: Diagnosis not present

## 2020-03-20 ENCOUNTER — Encounter: Payer: Self-pay | Admitting: Certified Nurse Midwife

## 2020-03-20 ENCOUNTER — Ambulatory Visit: Payer: BC Managed Care – PPO | Admitting: Certified Nurse Midwife

## 2020-03-20 VITALS — BP 100/67 | Wt 120.1 lb

## 2020-03-20 DIAGNOSIS — O210 Mild hyperemesis gravidarum: Secondary | ICD-10-CM

## 2020-03-20 DIAGNOSIS — Z3401 Encounter for supervision of normal first pregnancy, first trimester: Secondary | ICD-10-CM

## 2020-03-20 DIAGNOSIS — Z833 Family history of diabetes mellitus: Secondary | ICD-10-CM | POA: Insufficient documentation

## 2020-03-20 DIAGNOSIS — Z3A11 11 weeks gestation of pregnancy: Secondary | ICD-10-CM

## 2020-03-20 DIAGNOSIS — Z130 Encounter for screening for diseases of the blood and blood-forming organs and certain disorders involving the immune mechanism: Secondary | ICD-10-CM

## 2020-03-20 DIAGNOSIS — Z6710 Type A blood, Rh positive: Secondary | ICD-10-CM | POA: Insufficient documentation

## 2020-03-20 LAB — POCT URINALYSIS DIPSTICK OB
Bilirubin, UA: NEGATIVE
Blood, UA: NEGATIVE
Glucose, UA: NEGATIVE
Ketones, UA: NEGATIVE
Leukocytes, UA: NEGATIVE
Nitrite, UA: NEGATIVE
POC,PROTEIN,UA: NEGATIVE
Spec Grav, UA: 1.02 (ref 1.010–1.025)
Urobilinogen, UA: 0.2 E.U./dL
pH, UA: 6.5 (ref 5.0–8.0)

## 2020-03-20 NOTE — Progress Notes (Signed)
Pt present for initial prenatal visit.

## 2020-03-20 NOTE — Patient Instructions (Signed)
WHAT OB PATIENTS CAN EXPECT   Confirmation of pregnancy and ultrasound ordered if medically indicated-[redacted] weeks gestation  New OB (NOB) intake with nurse and New OB (NOB) labs- [redacted] weeks gestation  New OB (NOB) physical examination with provider- 11/[redacted] weeks gestation  Flu vaccine-[redacted] weeks gestation  Anatomy scan-[redacted] weeks gestation  Glucose tolerance test, blood work to test for anemia, T-dap vaccine-[redacted] weeks gestation  Vaginal swabs/cultures-STD/Group B strep-[redacted] weeks gestation  Appointments every 4 weeks until 28 weeks  Every 2 weeks from 28 weeks until 36 weeks  Weekly visits from 36 weeks until delivery    Healthy Weight Gain During Pregnancy, Adult A certain amount of weight gain during pregnancy is normal and healthy. How much weight you should gain depends on your overall health and a measurement called BMI (body mass index). BMI is an estimate of your body fat based on your height and weight. You can use an Freight forwarder to figure out your BMI, or you can ask your health care provider to calculate it for you at your next visit. Your recommended pregnancy weight gain is based on your pre-pregnancy BMI. General guidelines for a healthy total weight gain during pregnancy are listed below. If your BMI at or before the start of your pregnancy is:  Less than 18.5 (underweight), you should gain 28-40 lb (13-18 kg).  18.5-24.9 (normal weight), you should gain 25-35 lb (11-16 kg).  25-29.9 (overweight), you should gain 15-25 lb (7-11 kg).  30 or higher (obese), you should gain 11-20 lb (5-9 kg). These ranges vary depending on your individual health. If you are carrying more than one baby (multiples), it may be safe to gain more weight than these recommendations. If you gain less weight than recommended, that may be safe as long as your baby is growing and developing normally. How can unhealthy weight gain affect me and my baby? Gaining too much weight during pregnancy can lead to  pregnancy complications, such as:  A temporary form of diabetes that develops during pregnancy (gestational diabetes).  High blood pressure during pregnancy and protein in your urine (preeclampsia).  High blood pressure during pregnancy without protein in your urine (gestational hypertension).  Your baby having a high weight at birth, which may: ? Raise your risk of having a more difficult delivery or a surgical delivery (cesarean delivery, or C-section). ? Raise your child's risk of developing obesity during childhood. Not gaining enough weight can be life-threatening for your baby, and it may raise your baby's chances of:  Being born early (preterm).  Growing more slowly than normal during pregnancy (growth restriction).  Having a low weight at birth. What actions can I take to gain a healthy amount of weight during pregnancy? General instructions  Keep track of your weight gain during pregnancy.  Take over-the-counter and prescription medicines only as told by your health care provider. Take all prenatal supplements as directed.  Keep all health care visits during pregnancy (prenatal visits). These visits are a good time to discuss your weight gain. Your health care provider will weigh you at each visit to make sure you are gaining a healthy amount of weight. Nutrition   Eat a balanced, nutrient-rich diet. Eat plenty of: ? Fruits and vegetables, such as berries and broccoli. ? Whole grains, such as millet, barley, whole-wheat breads and cereals, and oatmeal. ? Low-fat dairy products or non-dairy products such as almond milk or rice milk. ? Protein foods, such as lean meat, chicken, eggs, and legumes (such as  peas, beans, soybeans, and lentils).  Avoid foods that are fried or have a lot of fat, salt (sodium), or sugar.  Drink enough fluid to keep your urine pale yellow.  Choose healthy snack and drink options when you are at work or on the go: ? Drink water. Avoid soda,  sports drinks, and juices that have added sugar. ? Avoid drinks with caffeine, such as coffee and energy drinks. ? Eat snacks that are high in protein, such as nuts, protein bars, and low-fat yogurt. ? Carry convenient snacks in your purse that do not need refrigeration, such as a pack of trail mix, an apple, or a granola bar.  If you need help improving your diet, work with a health care provider or a diet and nutrition specialist (dietitian). Activity   Exercise regularly, as told by your health care provider. ? If you were active before becoming pregnant, you may be able to continue your regular fitness activities. ? If you were not active before pregnancy, you may gradually build up to exercising for 30 or more minutes on most days of the week. This may include walking, swimming, or yoga.  Ask your health care provider what activities are safe for you. Talk with your health care provider about whether you may need to be excused from certain school or work activities. Where to find more information Learn more about managing your weight gain during pregnancy from:  American Pregnancy Association: www.americanpregnancy.org  [place] Department of Agriculture pregnancy weight gain calculator: [employer] Summary  Too much weight gain during pregnancy can lead to complications for you and your baby.  Find out your pre-pregnancy BMI to determine how much weight gain is healthy for you.  Eat nutritious foods and stay active.  Keep all of your prenatal visits as told by your health care provider. This information is not intended to replace advice given to you by your health care provider. Make sure you discuss any questions you have with your health care provider. Document Revised: 04/24/2019 Document Reviewed: 04/21/2017 Elsevier Patient Education  [place].   Exercise During Pregnancy Exercise is an important part of being healthy for people of all ages. Exercise  improves the function of your heart and lungs and helps you maintain strength, flexibility, and a healthy body weight. Exercise also boosts energy levels and elevates mood. Most women should exercise regularly during pregnancy. In rare cases, women with certain medical conditions or complications may be asked to limit or avoid exercise during pregnancy. How does this affect me? Along with maintaining general strength and flexibility, exercising during pregnancy can help:  Keep strength in muscles that are used during labor and childbirth.  Decrease low back pain.  Reduce symptoms of depression.  Control weight gain during pregnancy.  Reduce the risk of needing insulin if you develop diabetes during pregnancy.  Decrease the risk of cesarean delivery.  Speed up your recovery after giving birth. How does this affect my baby? Exercise can help you have a healthy pregnancy. Exercise does not cause premature birth. It will not cause your baby to weigh less at birth. What exercises can I do? Many exercises are safe for you to do during pregnancy. Do a variety of exercises that safely increase your heart and breathing rates and help you build and maintain muscle strength. Do exercises exactly as told by your health care provider. You may do these exercises:  Walking or hiking.  Swimming.  Water aerobics.  Riding a stationary bike.  Strength  training.  Modified yoga or Pilates. Tell your instructor that you are pregnant. Avoid overstretching, and avoid lying on your back for long periods of time.  Running or jogging. Only choose this type of exercise if you: ? Ran or jogged regularly before your pregnancy. ? Can run or jog and still talk in complete sentences. What exercises should I avoid? Depending on your level of fitness and whether you exercised regularly before your pregnancy, you may be told to limit high-intensity exercise. You can tell that you are exercising at a high  intensity if you are breathing much harder and faster and cannot hold a conversation while exercising. You must avoid:  Contact sports.  Activities that put you at risk for falling on or being hit in the belly, such as downhill skiing, water skiing, surfing, rock climbing, cycling, gymnastics, and horseback riding.  Scuba diving.  Skydiving.  Yoga or Pilates in a room that is heated to high temperatures.  Jogging or running, unless you ran or jogged regularly before your pregnancy. While jogging or running, you should always be able to talk in full sentences. Do not run or jog so fast that you are unable to have a conversation.  Do not exercise at more than 6,000 feet above sea level (high elevation) if you are not used to exercising at high elevation. How do I exercise in a safe way?   Avoid overheating. Do not exercise in very high temperatures.  Wear loose-fitting, breathable clothes.  Avoid dehydration. Drink enough water before, during, and after exercise to keep your urine pale yellow.  Avoid overstretching. Because of hormone changes during pregnancy, it is easy to overstretch muscles, tendons, and ligaments during pregnancy.  Start slowly and ask your health care provider to recommend the types of exercise that are safe for you.  Do not exercise to lose weight. Follow these instructions at home:  Exercise on most days or all days of the week. Try to exercise for 30 minutes a day, 5 days a week, unless your health care provider tells you not to.  If you actively exercised before your pregnancy and you are healthy, your health care provider may tell you to continue to do moderate to high-intensity exercise.  If you are just starting to exercise or did not exercise much before your pregnancy, your health care provider may tell you to do low to moderate-intensity exercise. Questions to ask your health care provider  Is exercise safe for me?  What are signs that I should  stop exercising?  Does my health condition mean that I should not exercise during pregnancy?  When should I avoid exercising during pregnancy? Stop exercising and contact a health care provider if: You have any unusual symptoms, such as:  Mild contractions of the uterus or cramps in the abdomen.  Dizziness that does not go away when you rest. Stop exercising and get help right away if: You have any unusual symptoms, such as:  Sudden, severe pain in your low back or your belly.  Mild contractions of the uterus or cramps in the abdomen that do not improve with rest and drinking fluids.  Chest pain.  Bleeding or fluid leaking from your vagina.  Shortness of breath. These symptoms may represent a serious problem that is an emergency. Do not wait to see if the symptoms will go away. Get medical help right away. Call your local emergency services (911 in the [place] Do not drive yourself to the hospital. Summary  Most  women should exercise regularly throughout pregnancy. In rare cases, women with certain medical conditions or complications may be asked to limit or avoid exercise during pregnancy.  Do not exercise to lose weight during pregnancy.  Your health care provider will tell you what level of physical activity is right for you.  Stop exercising and contact a health care provider if you have mild contractions of the uterus or cramps in the abdomen. Get help right away if these contractions or cramps do not improve with rest and drinking fluids.  Stop exercising and get help right away if you have sudden, severe pain in your low back or belly, chest pain, shortness of breath, or bleeding or leaking of fluid from your vagina. This information is not intended to replace advice given to you by your health care provider. Make sure you discuss any questions you have with your health care provider. Document Revised: 11/22/2018 Document Reviewed: 09/05/2018 Elsevier Patient Education   2020 [employer]    Common Medications Safe in Pregnancy  Acne:      Constipation:  Benzoyl Peroxide     Colace  Clindamycin      Dulcolax Suppository  Topica Erythromycin     Fibercon  Salicylic Acid      Metamucil         Miralax AVOID:        Senakot   Accutane    Cough:  Retin-A       Cough Drops  Tetracycline      Phenergan w/ Codeine if Rx  Minocycline      Robitussin (Plain & DM)  Antibiotics:     Crabs/Lice:  Ceclor       RID  Cephalosporins    AVOID:  E-Mycins      Kwell  Keflex  Macrobid/Macrodantin   Diarrhea:  Penicillin      Kao-Pectate  Zithromax      Imodium AD         PUSH FLUIDS AVOID:       Cipro     Fever:  Tetracycline      Tylenol (Regular or Extra  Minocycline       Strength)  Levaquin      Extra Strength-Do not          Exceed 8 tabs/24 hrs Caffeine:        <200mg/day (equiv. To 1 cup of coffee or  approx. 3 12 oz sodas)         Gas: Cold/Hayfever:       Gas-X  Benadryl      Mylicon  Claritin       Phazyme  **Claritin-D        Chlor-Trimeton    Headaches:  Dimetapp      ASA-Free Excedrin  Drixoral-Non-Drowsy     Cold Compress  Mucinex (Guaifenasin)     Tylenol (Regular or Extra  Sudafed/Sudafed-12 Hour     Strength)  **Sudafed PE Pseudoephedrine   Tylenol Cold & Sinus     Vicks Vapor Rub  Zyrtec  **AVOID if Problems With Blood Pressure         Heartburn: Avoid lying down for at least 1 hour after meals  Aciphex      Maalox     Rash:  Milk of Magnesia     Benadryl    Mylanta       1% Hydrocortisone Cream  Pepcid  Pepcid Complete   Sleep Aids:  Prevacid      Ambien     Prilosec       Benadryl  Rolaids       Chamomile Tea  Tums (Limit 4/day)     Unisom         Tylenol PM         Warm milk-add vanilla or  Hemorrhoids:       Sugar for taste  Anusol/Anusol H.C.  (RX: Analapram 2.5%)  Sugar Substitutes:  Hydrocortisone OTC     Ok in moderation  Preparation H      Tucks        Vaseline lotion applied to tissue with  wiping    Herpes:     Throat:  Acyclovir      Oragel  Famvir  Valtrex     Vaccines:         Flu Shot Leg Cramps:       *Gardasil  Benadryl      Hepatitis A         Hepatitis B Nasal Spray:       Pneumovax  Saline Nasal Spray     Polio Booster         Tetanus Nausea:       Tuberculosis test or PPD  Vitamin B6 25 mg TID   AVOID:    Dramamine      *Gardasil  Emetrol       Live Poliovirus  Ginger Root 250 mg QID    MMR (measles, mumps &  High Complex Carbs @ Bedtime    rebella)  Sea Bands-Accupressure    Varicella (Chickenpox)  Unisom 1/2 tab TID     *No known complications           If received before Pain:         Known pregnancy;   Darvocet       Resume series after  Lortab        Delivery  Percocet    Yeast:   Tramadol      Femstat  Tylenol 3      Gyne-lotrimin  Ultram       Monistat  Vicodin           MISC:         All Sunscreens           Hair Coloring/highlights          Insect Repellant's          (Including DEET)         Mystic Tans  

## 2020-03-20 NOTE — Progress Notes (Addendum)
NEW OB HISTORY AND PHYSICAL  SUBJECTIVE:       Linda Mendez is a 24 y.o. G1P0 female, Patient's last menstrual period was 01/03/2020., Estimated Date of Delivery: 10/09/20, [redacted]w[redacted]d, presents today for establishment of Prenatal Care.  Endorses nausea without vomiting and breast tenderness.   Declines genetic screening. Prefers midwifery care.   Denies difficulty breathing or respiratory distress, chest pain, abdominal pain, vaginal bleeding, dysuria, and leg pain or swelling.    Gynecologic History  Patient's last menstrual period was 01/03/2020.   Contraception: none   Last Pap: 09/2017. Results were: normal  Obstetric History  OB History  Gravida Para Term Preterm AB Living  1            SAB TAB Ectopic Multiple Live Births               # Outcome Date GA Lbr Len/2nd Weight Sex Delivery Anes PTL Lv  1 Current             Past Medical History:  Diagnosis Date  . Arm fracture, left   . Hemorrhoids   . Mononucleosis     Past Surgical History:  Procedure Laterality Date  . arm surgery    . blocked tear duct surgery    . FRACTURE SURGERY Left 2003   Left arm     Current Outpatient Medications on File Prior to Visit  Medication Sig Dispense Refill  . Prenatal Vit-Fe Fumarate-FA (PRENATAL VITAMIN PO) Take by mouth daily.     No current facility-administered medications on file prior to visit.    Allergies  Allergen Reactions  . Amoxicillin Hives  . Penicillins Hives    Social History   Socioeconomic History  . Marital status: Married    Spouse name: Not on file  . Number of children: Not on file  . Years of education: Not on file  . Highest education level: Not on file  Occupational History  . Not on file  Tobacco Use  . Smoking status: Never Smoker  . Smokeless tobacco: Never Used  Vaping Use  . Vaping Use: Never used  Substance and Sexual Activity  . Alcohol use: Not Currently    Comment: Occasionally  . Drug use: No  . Sexual activity: Yes     Partners: Male    Birth control/protection: None  Other Topics Concern  . Not on file  Social History Narrative  . Not on file   Social Determinants of Health   Financial Resource Strain:   . Difficulty of Paying Living Expenses:   Food Insecurity:   . Worried About Programme researcher, broadcasting/film/video in the Last Year:   . Barista in the Last Year:   Transportation Needs:   . Freight forwarder (Medical):   Marland Kitchen Lack of Transportation (Non-Medical):   Physical Activity:   . Days of Exercise per Week:   . Minutes of Exercise per Session:   Stress:   . Feeling of Stress :   Social Connections:   . Frequency of Communication with Friends and Family:   . Frequency of Social Gatherings with Friends and Family:   . Attends Religious Services:   . Active Member of Clubs or Organizations:   . Attends Banker Meetings:   Marland Kitchen Marital Status:   Intimate Partner Violence:   . Fear of Current or Ex-Partner:   . Emotionally Abused:   Marland Kitchen Physically Abused:   . Sexually Abused:     Family History  Problem Relation Age of Onset  . Thyroid disease Mother   . Migraines Mother   . Hypersomnolence Mother   . Hyperlipidemia Father   . Hyperlipidemia Paternal Grandfather   . Skin cancer Maternal Grandmother   . Skin cancer Paternal Grandmother     The following portions of the patient's history were reviewed and updated as appropriate: allergies, current medications, past OB history, past medical history, past surgical history, past family history, past social history, and problem list.  Review of Systems:  ROS negative except as noted above. Information obtained from patient.    OBJECTIVE:  BP 100/67   Wt 120 lb 1.6 oz (54.5 kg)   LMP 01/03/2020   BMI 21.27 kg/m   Initial Physical Exam (New OB)  GENERAL APPEARANCE: alert, well appearing, in no apparent distress  HEAD: normocephalic, atraumatic  MOUTH: mucous membranes moist, pharynx normal without lesions  THYROID:  no thyromegaly or masses present  BREASTS: no masses noted, no significant tenderness, no palpable axillary nodes, no skin changes  LUNGS: clear to auscultation, no wheezes, rales or rhonchi, symmetric air entry  HEART: regular rate and rhythm, no murmurs  ABDOMEN: soft, nontender, nondistended, no abnormal masses, no epigastric pain and FHT present  EXTREMITIES: no redness or tenderness in the calves or thighs, no edema  SKIN: normal coloration and turgor, no rashes  LYMPH NODES: no adenopathy palpable  NEUROLOGIC: alert, oriented, normal speech, no focal findings or movement disorder noted  PELVIC EXAM EXTERNAL GENITALIA: normal appearing vulva with no masses, tenderness or lesions VAGINA: no abnormal discharge or lesions CERVIX: no lesions or cervical motion tenderness UTERUS: gravid and consistent with 11 weeks ADNEXA: no masses palpable and nontender OB EXAM PELVIMETRY: appears adequate  ASSESSMENT: Normal pregnancy Rh positive Morning sickness Declines genetic screening Prefer midwifery care Screen for anemia-CBC ordered Family history of gestational diabetes in mother-A1c ordered  PLAN: Prenatal care New OB counseling: The patient has been given an overview regarding routine prenatal care. Recommendations regarding diet, weight gain, and exercise in pregnancy were given. Prenatal testing, optional genetic testing, and ultrasound use in pregnancy were reviewed.  Benefits of Breast Feeding were discussed. The patient is encouraged to consider nursing her baby post partum. See orders

## 2020-03-21 LAB — HEMOGLOBIN A1C
Est. average glucose Bld gHb Est-mCnc: 97 mg/dL
Hgb A1c MFr Bld: 5 % (ref 4.8–5.6)

## 2020-03-21 LAB — CBC
Hematocrit: 34.2 % (ref 34.0–46.6)
Hemoglobin: 11.1 g/dL (ref 11.1–15.9)
MCH: 29.8 pg (ref 26.6–33.0)
MCHC: 32.5 g/dL (ref 31.5–35.7)
MCV: 92 fL (ref 79–97)
Platelets: 242 10*3/uL (ref 150–450)
RBC: 3.73 x10E6/uL — ABNORMAL LOW (ref 3.77–5.28)
RDW: 12.4 % (ref 11.7–15.4)
WBC: 5.7 10*3/uL (ref 3.4–10.8)

## 2020-04-14 ENCOUNTER — Ambulatory Visit (INDEPENDENT_AMBULATORY_CARE_PROVIDER_SITE_OTHER): Payer: BC Managed Care – PPO | Admitting: Certified Nurse Midwife

## 2020-04-14 ENCOUNTER — Other Ambulatory Visit: Payer: Self-pay

## 2020-04-14 ENCOUNTER — Encounter: Payer: Self-pay | Admitting: Certified Nurse Midwife

## 2020-04-14 DIAGNOSIS — Z3A14 14 weeks gestation of pregnancy: Secondary | ICD-10-CM

## 2020-04-14 NOTE — Progress Notes (Signed)
ROB doing well. Not feeing fetal movement yet. Reassurance given .Discussed u/s at next visit for anatomy. Follow up 5 wks with Marcelino Duster.   Doreene Burke, CNM

## 2020-04-14 NOTE — Patient Instructions (Signed)

## 2020-05-15 ENCOUNTER — Telehealth: Payer: Self-pay

## 2020-05-15 NOTE — Telephone Encounter (Signed)
Called pt to go over ob payment plan. Informed the pt about our ob payment policy and went over what I spoke to [place] with her  insurance about. The pt verbally understood the payments and was made aware that she will receive a copy of the plan at her next visit

## 2020-05-21 ENCOUNTER — Ambulatory Visit (INDEPENDENT_AMBULATORY_CARE_PROVIDER_SITE_OTHER): Payer: BC Managed Care – PPO

## 2020-05-21 ENCOUNTER — Ambulatory Visit (INDEPENDENT_AMBULATORY_CARE_PROVIDER_SITE_OTHER): Payer: BC Managed Care – PPO | Admitting: Certified Nurse Midwife

## 2020-05-21 ENCOUNTER — Other Ambulatory Visit: Payer: Self-pay

## 2020-05-21 ENCOUNTER — Other Ambulatory Visit: Payer: Self-pay | Admitting: Certified Nurse Midwife

## 2020-05-21 VITALS — BP 108/59 | HR 90 | Wt 124.4 lb

## 2020-05-21 DIAGNOSIS — Z3A19 19 weeks gestation of pregnancy: Secondary | ICD-10-CM | POA: Diagnosis not present

## 2020-05-21 DIAGNOSIS — Z09 Encounter for follow-up examination after completed treatment for conditions other than malignant neoplasm: Secondary | ICD-10-CM

## 2020-05-21 DIAGNOSIS — Z3402 Encounter for supervision of normal first pregnancy, second trimester: Secondary | ICD-10-CM

## 2020-05-21 DIAGNOSIS — Z23 Encounter for immunization: Secondary | ICD-10-CM | POA: Diagnosis not present

## 2020-05-21 LAB — POCT URINALYSIS DIPSTICK OB
Bilirubin, UA: NEGATIVE
Blood, UA: NEGATIVE
Glucose, UA: NEGATIVE
Ketones, UA: NEGATIVE
Leukocytes, UA: NEGATIVE
Nitrite, UA: NEGATIVE
POC,PROTEIN,UA: NEGATIVE
Spec Grav, UA: <=1.005 — AB (ref 1.010–1.025)
Urobilinogen, UA: 0.2 E.U./dL
pH, UA: 7.5 (ref 5.0–8.0)

## 2020-05-21 NOTE — Patient Instructions (Signed)
Second Trimester of Pregnancy  The second trimester is from week 14 through week 27 (month 4 through 6). This is often the time in pregnancy that you feel your best. Often times, morning sickness has lessened or quit. You may have more energy, and you may get hungry more often. Your unborn baby is growing rapidly. At the end of the sixth month, he or she is about 9 inches long and weighs about 1 pounds. You will likely feel the baby move between 18 and 20 weeks of pregnancy. Follow these instructions at home: Medicines  Take over-the-counter and prescription medicines only as told by your doctor. Some medicines are safe and some medicines are not safe during pregnancy.  Take a prenatal vitamin that contains at least 600 micrograms (mcg) of folic acid.  If you have trouble pooping (constipation), take medicine that will make your stool soft (stool softener) if your doctor approves. Eating and drinking   Eat regular, healthy meals.  Avoid raw meat and uncooked cheese.  If you get low calcium from the food you eat, talk to your doctor about taking a daily calcium supplement.  Avoid foods that are high in fat and sugars, such as fried and sweet foods.  If you feel sick to your stomach (nauseous) or throw up (vomit): ? Eat 4 or 5 small meals a day instead of 3 large meals. ? Try eating a few soda crackers. ? Drink liquids between meals instead of during meals.  To prevent constipation: ? Eat foods that are high in fiber, like fresh fruits and vegetables, whole grains, and beans. ? Drink enough fluids to keep your pee (urine) clear or pale yellow. Activity  Exercise only as told by your doctor. Stop exercising if you start to have cramps.  Do not exercise if it is too hot, too humid, or if you are in a place of great height (high altitude).  Avoid heavy lifting.  Wear low-heeled shoes. Sit and stand up straight.  You can continue to have sex unless your doctor tells you not  to. Relieving pain and discomfort  Wear a good support bra if your breasts are tender.  Take warm water baths (sitz baths) to soothe pain or discomfort caused by hemorrhoids. Use hemorrhoid cream if your doctor approves.  Rest with your legs raised if you have leg cramps or low back pain.  If you develop puffy, bulging veins (varicose veins) in your legs: ? Wear support hose or compression stockings as told by your doctor. ? Raise (elevate) your feet for 15 minutes, 3-4 times a day. ? Limit salt in your food. Prenatal care  Write down your questions. Take them to your prenatal visits.  Keep all your prenatal visits as told by your doctor. This is important. Safety  Wear your seat belt when driving.  Make a list of emergency phone numbers, including numbers for family, friends, the hospital, and police and fire departments. General instructions  Ask your doctor about the right foods to eat or for help finding a counselor, if you need these services.  Ask your doctor about local prenatal classes. Begin classes before month 6 of your pregnancy.  Do not use hot tubs, steam rooms, or saunas.  Do not douche or use tampons or scented sanitary pads.  Do not cross your legs for long periods of time.  Visit your dentist if you have not done so. Use a soft toothbrush to brush your teeth. Floss gently.  Avoid all smoking, herbs,   and alcohol. Avoid drugs that are not approved by your doctor.  Do not use any products that contain nicotine or tobacco, such as cigarettes and e-cigarettes. If you need help quitting, ask your doctor.  Avoid cat litter boxes and soil used by cats. These carry germs that can cause birth defects in the baby and can cause a loss of your baby (miscarriage) or stillbirth. Contact a doctor if:  You have mild cramps or pressure in your lower belly.  You have pain when you pee (urinate).  You have bad smelling fluid coming from your vagina.  You continue to  feel sick to your stomach (nauseous), throw up (vomit), or have watery poop (diarrhea).  You have a nagging pain in your belly area.  You feel dizzy. Get help right away if:  You have a fever.  You are leaking fluid from your vagina.  You have spotting or bleeding from your vagina.  You have severe belly cramping or pain.  You lose or gain weight rapidly.  You have trouble catching your breath and have chest pain.  You notice sudden or extreme puffiness (swelling) of your face, hands, ankles, feet, or legs.  You have not felt the baby move in over an hour.  You have severe headaches that do not go away when you take medicine.  You have trouble seeing. Summary  The second trimester is from week 14 through week 27 (months 4 through 6). This is often the time in pregnancy that you feel your best.  To take care of yourself and your unborn baby, you will need to eat healthy meals, take medicines only if your doctor tells you to do so, and do activities that are safe for you and your baby.  Call your doctor if you get sick or if you notice anything unusual about your pregnancy. Also, call your doctor if you need help with the right food to eat, or if you want to know what activities are safe for you. This information is not intended to replace advice given to you by your health care provider. Make sure you discuss any questions you have with your health care provider. Document Revised: 11/23/2018 Document Reviewed: 09/06/2016 Elsevier Patient Education  2020 Elsevier Inc.   Back Pain in Pregnancy Back pain during pregnancy is common. Back pain may be caused by several factors that are related to changes during your pregnancy. Follow these instructions at home: Managing pain, stiffness, and swelling      If directed, for sudden (acute) back pain, put ice on the painful area. ? Put ice in a plastic bag. ? Place a towel between your skin and the bag. ? Leave the ice on for 20  minutes, 2-3 times per day.  If directed, apply heat to the affected area before you exercise. Use the heat source that your health care provider recommends, such as a moist heat pack or a heating pad. ? Place a towel between your skin and the heat source. ? Leave the heat on for 20-30 minutes. ? Remove the heat if your skin turns bright red. This is especially important if you are unable to feel pain, heat, or cold. You may have a greater risk of getting burned.  If directed, massage the affected area. Activity  Exercise as told by your health care provider. Gentle exercise is the best way to prevent or manage back pain.  Listen to your body when lifting. If lifting hurts, ask for help or bend your   knees. This uses your leg muscles instead of your back muscles.  Squat down when picking up something from the floor. Do not bend over.  Only use bed rest for short periods as told by your health care provider. Bed rest should only be used for the most severe episodes of back pain. Standing, sitting, and lying down  Do not stand in one place for long periods of time.  Use good posture when sitting. Make sure your head rests over your shoulders and is not hanging forward. Use a pillow on your lower back if necessary.  Try sleeping on your side, preferably the left side, with a pregnancy support pillow or 1-2 regular pillows between your legs. ? If you have back pain after a night's rest, your bed may be too soft. ? A firm mattress may provide more support for your back during pregnancy. General instructions  Do not wear high heels.  Eat a healthy diet. Try to gain weight within your health care provider's recommendations.  Use a maternity girdle, elastic sling, or back brace as told by your health care provider.  Take over-the-counter and prescription medicines only as told by your health care provider.  Work with a physical therapist or massage therapist to find ways to manage back  pain. Acupuncture or massage therapy may be helpful.  Keep all follow-up visits as told by your health care provider. This is important. Contact a health care provider if:  Your back pain interferes with your daily activities.  You have increasing pain in other parts of your body. Get help right away if:  You develop numbness, tingling, weakness, or problems with the use of your arms or legs.  You develop severe back pain that is not controlled with medicine.  You have a change in bowel or bladder control.  You develop shortness of breath, dizziness, or you faint.  You develop nausea, vomiting, or sweating.  You have back pain that is a rhythmic, cramping pain similar to labor pains. Labor pain is usually 1-2 minutes apart, lasts for about 1 minute, and involves a bearing down feeling or pressure in your pelvis.  You have back pain and your water breaks or you have vaginal bleeding.  You have back pain or numbness that travels down your leg.  Your back pain developed after you fell.  You develop pain on one side of your back.  You see blood in your urine.  You develop skin blisters in the area of your back pain. Summary  Back pain may be caused by several factors that are related to changes during your pregnancy.  Follow instructions as told by your health care provider for managing pain, stiffness, and swelling.  Exercise as told by your health care provider. Gentle exercise is the best way to prevent or manage back pain.  Take over-the-counter and prescription medicines only as told by your health care provider.  Keep all follow-up visits as told by your health care provider. This is important. This information is not intended to replace advice given to you by your health care provider. Make sure you discuss any questions you have with your health care provider. Document Revised: 11/20/2018 Document Reviewed: 01/17/2018 Elsevier Patient Education  2020 Elsevier  Inc.   Round Ligament Pain  The round ligament is a cord of muscle and tissue that helps support the uterus. It can become a source of pain during pregnancy if it becomes stretched or twisted as the baby grows. The pain usually   begins in the second trimester (13-28 weeks) of pregnancy, and it can come and go until the baby is delivered. It is not a serious problem, and it does not cause harm to the baby. Round ligament pain is usually a short, sharp, and pinching pain, but it can also be a dull, lingering, and aching pain. The pain is felt in the lower side of the abdomen or in the groin. It usually starts deep in the groin and moves up to the outside of the hip area. The pain may occur when you:  Suddenly change position, such as quickly going from a sitting to standing position.  Roll over in bed.  Cough or sneeze.  Do physical activity. Follow these instructions at home:   Watch your condition for any changes.  When the pain starts, relax. Then try any of these methods to help with the pain: ? Sitting down. ? Flexing your knees up to your abdomen. ? Lying on your side with one pillow under your abdomen and another pillow between your legs. ? Sitting in a warm bath for 15-20 minutes or until the pain goes away.  Take over-the-counter and prescription medicines only as told by your health care provider.  Move slowly when you sit down or stand up.  Avoid long walks if they cause pain.  Stop or reduce your physical activities if they cause pain.  Keep all follow-up visits as told by your health care provider. This is important. Contact a health care provider if:  Your pain does not go away with treatment.  You feel pain in your back that you did not have before.  Your medicine is not helping. Get help right away if:  You have a fever or chills.  You develop uterine contractions.  You have vaginal bleeding.  You have nausea or vomiting.  You have diarrhea.  You  have pain when you urinate. Summary  Round ligament pain is felt in the lower abdomen or groin. It is usually a short, sharp, and pinching pain. It can also be a dull, lingering, and aching pain.  This pain usually begins in the second trimester (13-28 weeks). It occurs because the uterus is stretching with the growing baby, and it is not harmful to the baby.  You may notice the pain when you suddenly change position, when you cough or sneeze, or during physical activity.  Relaxing, flexing your knees to your abdomen, lying on one side, or taking a warm bath may help to get rid of the pain.  Get help from your health care provider if the pain does not go away or if you have vaginal bleeding, nausea, vomiting, diarrhea, or painful urination. This information is not intended to replace advice given to you by your health care provider. Make sure you discuss any questions you have with your health care provider. Document Revised: 01/17/2018 Document Reviewed: 01/17/2018 Elsevier Patient Education  2020 Elsevier Inc.  

## 2020-05-21 NOTE — Progress Notes (Signed)
ROB-Doing well. Anatomy scan today complete and normal, see below. Questions answered. Illustrated Birth Choices, Breastfeeding, and Doula handouts from the [employer] provided. Anticipatory guidance regarding course of prenatal care. Reviewed red flag symptoms and when to call. RTC x 4 weeks for ROB or sooner if needed.   ULTRASOUND REPORT  Location: [facility] Date of Service: 05/21/2020   Indications:Anatomy Ultrasound Findings:  Singleton intrauterine pregnancy is visualized with FHR at 155 BPM. Biometrics give an (U/S) Gestational age of [redacted]w[redacted]d and an (U/S) EDD of 10/12/2020; this correlates with the clinically established Estimated Date of Delivery: 10/09/20  Fetal presentation is transverse.  EFW: 283 g ( 10 oz). Fetal Percentile  Placenta: fundal. Grade: 1 AFI: subjectively normal.  Anatomic survey is complete and normal; Gender - surprise.    Right Ovary is normal in appearance. Left Ovary is normal appearance. Survey of the adnexa demonstrates no adnexal masses. There is no free peritoneal fluid in the cul de sac.  Impression: 1. [redacted]w[redacted]d Viable Singleton Intrauterine pregnancy by U/S. 2. (U/S) EDD is consistent with Clinically established Estimated Date of Delivery: 10/09/20 . 3. Normal Anatomy Scan  Recommendations: 1.Clinical correlation with the patient's History and Physical Exam.

## 2020-06-05 ENCOUNTER — Other Ambulatory Visit (HOSPITAL_COMMUNITY)
Admission: RE | Admit: 2020-06-05 | Discharge: 2020-06-05 | Disposition: A | Discharge status: Home / Self Care | Payer: BC Managed Care – PPO | Source: Ambulatory Visit | Attending: Certified Nurse Midwife | Admitting: Certified Nurse Midwife

## 2020-06-05 ENCOUNTER — Ambulatory Visit (INDEPENDENT_AMBULATORY_CARE_PROVIDER_SITE_OTHER): Payer: BC Managed Care – PPO | Admitting: Certified Nurse Midwife

## 2020-06-05 ENCOUNTER — Encounter: Payer: Self-pay | Admitting: Certified Nurse Midwife

## 2020-06-05 ENCOUNTER — Other Ambulatory Visit: Payer: Self-pay

## 2020-06-05 VITALS — BP 103/64 | HR 110 | Wt 126.3 lb

## 2020-06-05 DIAGNOSIS — O26892 Other specified pregnancy related conditions, second trimester: Secondary | ICD-10-CM

## 2020-06-05 DIAGNOSIS — N898 Other specified noninflammatory disorders of vagina: Secondary | ICD-10-CM | POA: Diagnosis present

## 2020-06-05 DIAGNOSIS — Z3A22 22 weeks gestation of pregnancy: Secondary | ICD-10-CM | POA: Diagnosis present

## 2020-06-05 DIAGNOSIS — Z3402 Encounter for supervision of normal first pregnancy, second trimester: Secondary | ICD-10-CM | POA: Insufficient documentation

## 2020-06-05 LAB — POCT URINALYSIS DIPSTICK OB
Bilirubin, UA: NEGATIVE
Blood, UA: NEGATIVE
Glucose, UA: NEGATIVE
Ketones, UA: NEGATIVE
Leukocytes, UA: NEGATIVE
Nitrite, UA: NEGATIVE
POC,PROTEIN,UA: NEGATIVE
Spec Grav, UA: 1.015 (ref 1.010–1.025)
Urobilinogen, UA: 0.2 E.U./dL
pH, UA: 7 (ref 5.0–8.0)

## 2020-06-05 NOTE — Patient Instructions (Signed)

## 2020-06-05 NOTE — Progress Notes (Signed)
Subjective:   Akhila Mahnken is a 24 y.o. G1P0 [redacted]w[redacted]d being seen today for her working in problem prenatal visit.  Patient reports increased gray vaginal discharge for the last three (3) to four (4) days with mild itching yesterday.    Denies contractions, vaginal bleeding or leaking of fluid.  Reports good fetal movement.  The following portions of the patient's history were reviewed and updated as appropriate: allergies, current medications, past family history, past medical history, past social history, past surgical history and problem list.   Review of Systems:  ROS negative except as noted above. Information obtained from patient.   Objective:   BP 103/64   Pulse (!) 110   Wt 126 lb 4.8 oz (57.3 kg)   LMP 01/03/2020   BMI 22.37 kg/m    Fetal Movement: Movement: Present    Abdomen:  Soft, gravid, appropriate for gestational age,non-tender  Vaginal:  Discharge, grey and yellow   Vulva: Erythema present   Results for orders placed or performed in visit on 06/05/20 (from the past 24 hour(s))  POC Urinalysis Dipstick OB     Status: None   Collection Time: 06/05/20  3:42 PM  Result Value Ref Range   Color, UA a[place]    Clarity, UA clear    Glucose, UA Negative Negative   Bilirubin, UA neg    Ketones, UA neg    Spec Grav, UA 1.015 1.010 - 1.025   Blood, UA neg    pH, UA 7.0 5.0 - 8.0   POC,PROTEIN,UA Negative Negative, Trace, Small (1+), Moderate (2+), Large (3+), 4+   Urobilinogen, UA 0.2 0.2 or 1.0 E.U./dL   Nitrite, UA neg    Leukocytes, UA Negative Negative   Appearance     Odor      Assessment:   Pregnancy:  G1P0 at [redacted]w[redacted]d  1. Encounter for supervision of normal first pregnancy in second trimester  - Cervicovaginal ancillary only  2. [redacted] weeks gestation of pregnancy  - Cervicovaginal ancillary only  3. Vaginal discharge during pregnancy in second trimester  - Cervicovaginal ancillary only  4. Itching in the vaginal area  - Cervicovaginal ancillary  only   Plan:   Vaginal swab collected, see orders.   Preterm labor symptoms: vaginal bleeding, contractions and leaking of fluid reviewed in detail.  Fetal movement precautions reviewed.  Follow up as previously scheduled or sooner if needed.   [name], CNM [facility], [facility] 06/05/20 5:58 PM

## 2020-06-09 LAB — CERVICOVAGINAL ANCILLARY ONLY
Bacterial Vaginitis (gardnerella): NEGATIVE
Candida Glabrata: NEGATIVE
Candida Vaginitis: NEGATIVE
Comment: NEGATIVE
Comment: NEGATIVE
Comment: NEGATIVE

## 2020-06-24 ENCOUNTER — Encounter: Payer: Self-pay | Admitting: Certified Nurse Midwife

## 2020-06-24 ENCOUNTER — Ambulatory Visit (INDEPENDENT_AMBULATORY_CARE_PROVIDER_SITE_OTHER): Payer: BC Managed Care – PPO | Admitting: Certified Nurse Midwife

## 2020-06-24 ENCOUNTER — Other Ambulatory Visit: Payer: Self-pay

## 2020-06-24 VITALS — BP 99/60 | HR 82 | Wt 131.5 lb

## 2020-06-24 DIAGNOSIS — Z3A24 24 weeks gestation of pregnancy: Secondary | ICD-10-CM

## 2020-06-24 LAB — POCT URINALYSIS DIPSTICK OB
Bilirubin, UA: NEGATIVE
Blood, UA: NEGATIVE
Glucose, UA: NEGATIVE
Ketones, UA: NEGATIVE
Leukocytes, UA: NEGATIVE
Nitrite, UA: NEGATIVE
POC,PROTEIN,UA: NEGATIVE
Spec Grav, UA: 1.02 (ref 1.010–1.025)
Urobilinogen, UA: 0.2 E.U./dL
pH, UA: 5 (ref 5.0–8.0)

## 2020-06-24 NOTE — Progress Notes (Signed)
ROB dong well. Feels good movement. RSB reviewed. See checklist for information reviewed. Discussed use of tub for pain relief. Water birth class information given , tub rental information given. Position statements given. Explained that she would need to complete the class. She verbalizes and agrees to plan. Anticipatory guidance for 28 wk labs. Follow 4 wks with Marcelino Duster.   Doreene Burke, CNM

## 2020-06-24 NOTE — Patient Instructions (Signed)

## 2020-07-24 ENCOUNTER — Other Ambulatory Visit: Payer: BC Managed Care – PPO

## 2020-07-24 ENCOUNTER — Encounter: Payer: BC Managed Care – PPO | Admitting: Certified Nurse Midwife

## 2020-07-27 ENCOUNTER — Other Ambulatory Visit: Payer: Self-pay

## 2020-07-27 ENCOUNTER — Encounter: Payer: Self-pay | Admitting: Certified Nurse Midwife

## 2020-07-27 ENCOUNTER — Other Ambulatory Visit: Payer: BC Managed Care – PPO

## 2020-07-27 ENCOUNTER — Ambulatory Visit (INDEPENDENT_AMBULATORY_CARE_PROVIDER_SITE_OTHER): Payer: BC Managed Care – PPO | Admitting: Certified Nurse Midwife

## 2020-07-27 VITALS — BP 99/67 | HR 100 | Wt 138.4 lb

## 2020-07-27 DIAGNOSIS — Z3403 Encounter for supervision of normal first pregnancy, third trimester: Secondary | ICD-10-CM | POA: Diagnosis not present

## 2020-07-27 DIAGNOSIS — Z23 Encounter for immunization: Secondary | ICD-10-CM

## 2020-07-27 DIAGNOSIS — Z131 Encounter for screening for diabetes mellitus: Secondary | ICD-10-CM

## 2020-07-27 DIAGNOSIS — Z130 Encounter for screening for diseases of the blood and blood-forming organs and certain disorders involving the immune mechanism: Secondary | ICD-10-CM

## 2020-07-27 DIAGNOSIS — Z3A29 29 weeks gestation of pregnancy: Secondary | ICD-10-CM

## 2020-07-27 LAB — POCT URINALYSIS DIPSTICK OB
Bilirubin, UA: NEGATIVE
Blood, UA: NEGATIVE
Glucose, UA: NEGATIVE
Ketones, UA: NEGATIVE
Leukocytes, UA: NEGATIVE
Nitrite, UA: NEGATIVE
POC,PROTEIN,UA: NEGATIVE
Spec Grav, UA: 1.01 (ref 1.010–1.025)
Urobilinogen, UA: 0.2 E.U./dL
pH, UA: 7.5 (ref 5.0–8.0)

## 2020-07-27 MED ORDER — BREAST PUMP MISC: 0 refills | Status: AC

## 2020-07-27 NOTE — Progress Notes (Addendum)
ROB- Doing well, accompanied by husband. 28 week labs today, see orders. Third trimester handouts given. Already completed breastfeeding and birthing class. Enrolled in natural birth and water birth class virtually at [facility] Considering hydrotherapy. Breastfeeding education completed, see chart. TDaP given, see chart. Blood transfusion consent signed.  Questions answered. Rx Breast pump given to patient. Anticipatory guidance with course of prenatal care. Reviewed red flag symptoms and when to call. RTC x 2 weeks with Pattricia Boss or sooner if needed.  Juliann Pares, Student-MidWife [employer]  07/27/20 3:29 PM

## 2020-07-27 NOTE — Progress Notes (Signed)
I have seen, interviewed, and examined the patient in conjunction with the [employer] midwife and affirm the diagnosis and management plan.   Gunnar Bulla, CNM [facility], [facility] 07/27/20 5:31 PM

## 2020-07-27 NOTE — Progress Notes (Signed)
Pt present for routine prenatal visit. No complaints. Administered tdap. Pt tolerated well. No adverse reactions.

## 2020-07-27 NOTE — Patient Instructions (Signed)
Breastfeeding  Choosing to breastfeed is one of the best decisions you can make for yourself and your baby. A change in hormones during pregnancy causes your breasts to make breast milk in your milk-producing glands. Hormones prevent breast milk from being released before your baby is born. They also prompt milk flow after birth. Once breastfeeding has begun, thoughts of your baby, as well as his or her sucking or crying, can stimulate the release of milk from your milk-producing glands. Benefits of breastfeeding Research shows that breastfeeding offers many health benefits for infants and mothers. It also offers a cost-free and convenient way to feed your baby. For your baby  Your first milk (colostrum) helps your baby's digestive system to function better.  Special cells in your milk (antibodies) help your baby to fight off infections.  Breastfed babies are less likely to develop asthma, allergies, obesity, or type 2 diabetes. They are also at lower risk for sudden infant death syndrome (SIDS).  Nutrients in breast milk are better able to meet your baby's needs compared to infant formula.  Breast milk improves your baby's brain development. For you  Breastfeeding helps to create a very special bond between you and your baby.  Breastfeeding is convenient. Breast milk costs nothing and is always available at the correct temperature.  Breastfeeding helps to burn calories. It helps you to lose the weight that you gained during pregnancy.  Breastfeeding makes your uterus return faster to its size before pregnancy. It also slows bleeding (lochia) after you give birth.  Breastfeeding helps to lower your risk of developing type 2 diabetes, osteoporosis, rheumatoid arthritis, cardiovascular disease, and breast, ovarian, uterine, and endometrial cancer later in life. Breastfeeding basics Starting breastfeeding  Find a comfortable place to sit or lie down, with your neck and back  well-supported.  Place a pillow or a rolled-up blanket under your baby to bring him or her to the level of your breast (if you are seated). Nursing pillows are specially designed to help support your arms and your baby while you breastfeed.  Make sure that your baby's tummy (abdomen) is facing your abdomen.  Gently massage your breast. With your fingertips, massage from the outer edges of your breast inward toward the nipple. This encourages milk flow. If your milk flows slowly, you may need to continue this action during the feeding.  Support your breast with 4 fingers underneath and your thumb above your nipple (make the letter "C" with your hand). Make sure your fingers are well away from your nipple and your baby's mouth.  Stroke your baby's lips gently with your finger or nipple.  When your baby's mouth is open wide enough, quickly bring your baby to your breast, placing your entire nipple and as much of the areola as possible into your baby's mouth. The areola is the colored area around your nipple. ? More areola should be visible above your baby's upper lip than below the lower lip. ? Your baby's lips should be opened and extended outward (flanged) to ensure an adequate, comfortable latch. ? Your baby's tongue should be between his or her lower gum and your breast.  Make sure that your baby's mouth is correctly positioned around your nipple (latched). Your baby's lips should create a seal on your breast and be turned out (everted).  It is common for your baby to suck about 2-3 minutes in order to start the flow of breast milk. Latching Teaching your baby how to latch onto your breast properly is  very important. An improper latch can cause nipple pain, decreased milk supply, and poor weight gain in your baby. Also, if your baby is not latched onto your nipple properly, he or she may swallow some air during feeding. This can make your baby fussy. Burping your baby when you switch breasts  during the feeding can help to get rid of the air. However, teaching your baby to latch on properly is still the best way to prevent fussiness from swallowing air while breastfeeding. Signs that your baby has successfully latched onto your nipple  Silent tugging or silent sucking, without causing you pain. Infant's lips should be extended outward (flanged).  Swallowing heard between every 3-4 sucks once your milk has started to flow (after your let-down milk reflex occurs).  Muscle movement above and in front of his or her ears while sucking. Signs that your baby has not successfully latched onto your nipple  Sucking sounds or smacking sounds from your baby while breastfeeding.  Nipple pain. If you think your baby has not latched on correctly, slip your finger into the corner of your baby's mouth to break the suction and place it between your baby's gums. Attempt to start breastfeeding again. Signs of successful breastfeeding Signs from your baby  Your baby will gradually decrease the number of sucks or will completely stop sucking.  Your baby will fall asleep.  Your baby's body will relax.  Your baby will retain a small amount of milk in his or her mouth.  Your baby will let go of your breast by himself or herself. Signs from you  Breasts that have increased in firmness, weight, and size 1-3 hours after feeding.  Breasts that are softer immediately after breastfeeding.  Increased milk volume, as well as a change in milk consistency and color by the fifth day of breastfeeding.  Nipples that are not sore, cracked, or bleeding. Signs that your baby is getting enough milk  Wetting at least 1-2 diapers during the first 24 hours after birth.  Wetting at least 5-6 diapers every 24 hours for the first week after birth. The urine should be clear or pale yellow by the age of 5 days.  Wetting 6-8 diapers every 24 hours as your baby continues to grow and develop.  At least 3 stools in  a 24-hour period by the age of 5 days. The stool should be soft and yellow.  At least 3 stools in a 24-hour period by the age of 7 days. The stool should be seedy and yellow.  No loss of weight greater than 10% of birth weight during the first 3 days of life.  Average weight gain of 4-7 oz (113-198 g) per week after the age of 4 days.  Consistent daily weight gain by the age of 5 days, without weight loss after the age of 2 weeks. After a feeding, your baby may spit up a small amount of milk. This is normal. Breastfeeding frequency and duration Frequent feeding will help you make more milk and can prevent sore nipples and extremely full breasts (breast engorgement). Breastfeed when you feel the need to reduce the fullness of your breasts or when your baby shows signs of hunger. This is called "breastfeeding on demand." Signs that your baby is hungry include:  Increased alertness, activity, or restlessness.  Movement of the head from side to side.  Opening of the mouth when the corner of the mouth or cheek is stroked (rooting).  Increased sucking sounds, smacking lips, cooing,  sighing, or squeaking.  Hand-to-mouth movements and sucking on fingers or hands.  Fussing or crying. Avoid introducing a pacifier to your baby in the first 4-6 weeks after your baby is born. After this time, you may choose to use a pacifier. Research has shown that pacifier use during the first year of a baby's life decreases the risk of sudden infant death syndrome (SIDS). Allow your baby to feed on each breast as long as he or she wants. When your baby unlatches or falls asleep while feeding from the first breast, offer the second breast. Because newborns are often sleepy in the first few weeks of life, you may need to awaken your baby to get him or her to feed. Breastfeeding times will vary from baby to baby. However, the following rules can serve as a guide to help you make sure that your baby is properly  fed:  Newborns (babies 41 weeks of age or younger) may breastfeed every 1-3 hours.  Newborns should not go without breastfeeding for longer than 3 hours during the day or 5 hours during the night.  You should breastfeed your baby a minimum of 8 times in a 24-hour period. Breast milk pumping     Pumping and storing breast milk allows you to make sure that your baby is exclusively fed your breast milk, even at times when you are unable to breastfeed. This is especially important if you go back to work while you are still breastfeeding, or if you are not able to be present during feedings. Your lactation consultant can help you find a method of pumping that works best for you and give you guidelines about how long it is safe to store breast milk. Caring for your breasts while you breastfeed Nipples can become dry, cracked, and sore while breastfeeding. The following recommendations can help keep your breasts moisturized and healthy:  Avoid using soap on your nipples.  Wear a supportive bra designed especially for nursing. Avoid wearing underwire-style bras or extremely tight bras (sports bras).  Air-dry your nipples for 3-4 minutes after each feeding.  Use only cotton bra pads to absorb leaked breast milk. Leaking of breast milk between feedings is normal.  Use lanolin on your nipples after breastfeeding. Lanolin helps to maintain your skin's normal moisture barrier. Pure lanolin is not harmful (not toxic) to your baby. You may also hand express a few drops of breast milk and gently massage that milk into your nipples and allow the milk to air-dry. In the first few weeks after giving birth, some women experience breast engorgement. Engorgement can make your breasts feel heavy, warm, and tender to the touch. Engorgement peaks within 3-5 days after you give birth. The following recommendations can help to ease engorgement:  Completely empty your breasts while breastfeeding or pumping. You may  want to start by applying warm, moist heat (in the shower or with warm, water-soaked hand towels) just before feeding or pumping. This increases circulation and helps the milk flow. If your baby does not completely empty your breasts while breastfeeding, pump any extra milk after he or she is finished.  Apply ice packs to your breasts immediately after breastfeeding or pumping, unless this is too uncomfortable for you. To do this: ? Put ice in a plastic bag. ? Place a towel between your skin and the bag. ? Leave the ice on for 20 minutes, 2-3 times a day.  Make sure that your baby is latched on and positioned properly while breastfeeding. If  engorgement persists after 48 hours of following these recommendations, contact your health care provider or a Science writer. Overall health care recommendations while breastfeeding  Eat 3 healthy meals and 3 snacks every day. Well-nourished mothers who are breastfeeding need an additional 450-500 calories a day. You can meet this requirement by increasing the amount of a balanced diet that you eat.  Drink enough water to keep your urine pale yellow or clear.  Rest often, relax, and continue to take your prenatal vitamins to prevent fatigue, stress, and low vitamin and mineral levels in your body (nutrient deficiencies).  Do not use any products that contain nicotine or tobacco, such as cigarettes and e-cigarettes. Your baby may be harmed by chemicals from cigarettes that pass into breast milk and exposure to secondhand smoke. If you need help quitting, ask your health care provider.  Avoid alcohol.  Do not use illegal drugs or marijuana.  Talk with your health care provider before taking any medicines. These include over-the-counter and prescription medicines as well as vitamins and herbal supplements. Some medicines that may be harmful to your baby can pass through breast milk.  It is possible to become pregnant while breastfeeding. If birth  control is desired, ask your health care provider about options that will be safe while breastfeeding your baby. Where to find more information: [employer]: www.llli.org Contact a health care provider if:  You feel like you want to stop breastfeeding or have become frustrated with breastfeeding.  Your nipples are cracked or bleeding.  Your breasts are red, tender, or warm.  You have: ? Painful breasts or nipples. ? A swollen area on either breast. ? A fever or chills. ? Nausea or vomiting. ? Drainage other than breast milk from your nipples.  Your breasts do not become full before feedings by the fifth day after you give birth.  You feel sad and depressed.  Your baby is: ? Too sleepy to eat well. ? Having trouble sleeping. ? More than 48 week old and wetting fewer than 6 diapers in a 24-hour period. ? Not gaining weight by 64 days of age.  Your baby has fewer than 3 stools in a 24-hour period.  Your baby's skin or the white parts of his or her eyes become yellow. Get help right away if:  Your baby is overly tired (lethargic) and does not want to wake up and feed.  Your baby develops an unexplained fever. Summary  Breastfeeding offers many health benefits for infant and mothers.  Try to breastfeed your infant when he or she shows early signs of hunger.  Gently tickle or stroke your baby's lips with your finger or nipple to allow the baby to open his or her mouth. Bring the baby to your breast. Make sure that much of the areola is in your baby's mouth. Offer one side and burp the baby before you offer the other side.  Talk with your health care provider or lactation consultant if you have questions or you face problems as you breastfeed. This information is not intended to replace advice given to you by your health care provider. Make sure you discuss any questions you have with your health care provider. Document Revised: 10/26/2017 Document Reviewed:  09/02/2016 Elsevier Patient Education  [place].   Pain Relief During Labor and Delivery Many things can cause pain during labor and delivery, including:  Pressure on bones and ligaments due to the baby moving through the pelvis.  Stretching of tissues  due to the baby moving through the birth canal.  Muscle tension due to anxiety or nervousness.  The uterus tightening (contracting) and relaxing to help move the baby. There are many ways to deal with the pain of labor and delivery. They include:  Taking prenatal classes. Taking these classes helps you know what to expect during your baby's birth. What you learn will increase your confidence and decrease your anxiety.  Practicing relaxation techniques or doing relaxing activities, such as: ? Focused breathing. ? Meditation. ? Visualization. ? Aroma therapy. ? Listening to your favorite music. ? Hypnosis.  Taking a warm shower or bath (hydrotherapy). This may: ? Provide comfort and relaxation. ? Lessen your perception of pain. ? Decrease the amount of pain medicine needed. ? Decrease the length of labor.  Getting a massage or counterpressure on your back.  Applying warm packs or ice packs.  Changing positions often, moving around, or using a birthing ball.  Getting: ? Pain medicine through an IV or injection into a muscle. ? Pain medicine inserted into your spinal column. ? Injections of sterile water just under the skin on your lower back (intradermal injections). ? Laughing gas (nitrous oxide). Discuss your pain control options with your health care provider during your prenatal visits. Explore the options offered by your hospital or birth center. What kinds of medicine are available? There are two kinds of medicines that can be used to relieve pain during labor and delivery:  Analgesics. These medicines decrease pain without causing you to lose feeling or the ability to move your muscles.  Anesthetics. These  medicines block feeling in the body and can decrease your ability to move freely. Both of these kinds of medicine can cause minor side effects, such as nausea, trouble concentrating, and sleepiness. They can also decrease the baby's heart rate before birth and affect the baby's breathing rate after birth. For this reason, health care providers are careful about when and how much medicine is given. What are specific medicines and procedures that provide pain relief? Local Anesthetics Local anesthetics are used to numb a small area of the body. They may be used along with another kind of anesthetic or used to numb the nerves of the vagina, cervix, and perineum during the second stage of labor. General Anesthetics General anesthetics cause you to lose consciousness so you do not feel pain. They are usually only used for an emergency cesarean delivery. General anesthetics are given through an IV tube and a mask. Pudendal Block A pudendal block is a form of local anesthetic. It may be used to relieve the pain associated with pushing or stretching of the perineum at the time of delivery or to further numb the perineum. A pudendal block is done by injecting numbing medicine through the vaginal wall into a nerve in the pelvis. Epidural Analgesia Epidural analgesia is given through a flexible IV catheter that is inserted into the lower back. Numbing medicine is delivered continuously to the area near your spinal column nerves (epidural space). After having this type of analgesia, you may be able to move your legs but you most likely will not be able to walk. Depending on the amount of medicine given, you may lose all feeling in the lower half of your body, or you may retain some level of sensation, including the urge to push. Epidural analgesia can be used to provide pain relief for a vaginal birth. Spinal Block A spinal block is similar to epidural analgesia, but the medicine is  injected into the spinal fluid  instead of the epidural space. A spinal block is only given once. It starts to relieve pain quickly, but the pain relief lasts only 1-6 hours. Spinal blocks can be used for cesarean deliveries. Combined Spinal-Epidural (CSE) Block A CSE block combines the effects of a spinal block and epidural analgesia. The spinal block works quickly to block all pain. The epidural analgesia provides continuous pain relief, even after the effects of the spinal block have worn off. This information is not intended to replace advice given to you by your health care provider. Make sure you discuss any questions you have with your health care provider. Document Revised: 07/14/2017 Document Reviewed: 12/23/2015 Elsevier Patient Education  2020 [employer].   [facility]  [address], [place], [place] [address]  Phone: [phone]   [facility] (second location)  [address] [place], [place] [address]  Phone: [phone]   [facility]) [address], [place], [place] [address] Phone: [phone]   [address] [place], [place] [address]  Phone: [phone]  WHAT OB PATIENTS CAN EXPECT   Confirmation of pregnancy and ultrasound ordered if medically indicated-[redacted] weeks gestation  New OB (NOB) intake with nurse and New OB (NOB) labs- [redacted] weeks gestation  New OB (NOB) physical examination with provider- 11/[redacted] weeks gestation  Flu vaccine-[redacted] weeks gestation  Anatomy scan-[redacted] weeks gestation  Glucose tolerance test, blood work to test for anemia, T-dap vaccine-[redacted] weeks gestation  Vaginal swabs/cultures-STD/Group B strep-[redacted] weeks gestation  Appointments every 4 weeks until 28 weeks  Every 2 weeks from 28 weeks until 36 weeks  Weekly visits from 36 weeks until delivery    Common Medications Safe in Pregnancy  Acne:      Constipation:  Benzoyl  Peroxide     Colace  Clindamycin      Dulcolax Suppository  Topica Erythromycin     Fibercon  Salicylic Acid      Metamucil         Miralax AVOID:        Senakot   Accutane    Cough:  Retin-A       Cough Drops  Tetracycline      Phenergan w/ Codeine if Rx  Minocycline      Robitussin (Plain & DM)  Antibiotics:     Crabs/Lice:  Ceclor       RID  Cephalosporins    AVOID:  E-Mycins      Kwell  Keflex  Macrobid/Macrodantin   Diarrhea:  Penicillin      Kao-Pectate  Zithromax      Imodium AD         PUSH FLUIDS AVOID:       Cipro     Fever:  Tetracycline      Tylenol (Regular or Extra  Minocycline       Strength)  Levaquin      Extra Strength-Do not          Exceed 8 tabs/24 hrs Caffeine:        <21m/day (equiv. To 1 cup of coffee or  approx. 3 12 oz sodas)         Gas: Cold/Hayfever:       Gas-X  Benadryl      Mylicon  Claritin       Phazyme  **Claritin-D        Chlor-Trimeton    Headaches:  Dimetapp  ASA-Free Excedrin  Drixoral-Non-Drowsy     Cold Compress  Mucinex (Guaifenasin)     Tylenol (Regular or Extra  Sudafed/Sudafed-12 Hour     Strength)  **Sudafed PE Pseudoephedrine   Tylenol Cold & Sinus     Vicks Vapor Rub  Zyrtec  **AVOID if Problems With Blood Pressure         Heartburn: Avoid lying down for at least 1 hour after meals  Aciphex      Maalox     Rash:  Milk of Magnesia     Benadryl    Mylanta       1% Hydrocortisone Cream  Pepcid  Pepcid Complete   Sleep Aids:  Prevacid      Ambien   Prilosec       Benadryl  Rolaids       Chamomile Tea  Tums (Limit 4/day)     Unisom         Tylenol PM         Warm milk-add vanilla or  Hemorrhoids:       Sugar for taste  Anusol/Anusol H.C.  (RX: Analapram 2.5%)  Sugar Substitutes:  Hydrocortisone OTC     Ok in moderation  Preparation H      Tucks        Vaseline lotion applied to tissue with wiping    Herpes:     Throat:  Acyclovir      Oragel  Famvir  Valtrex     Vaccines:         Flu  Shot Leg Cramps:       *Gardasil  Benadryl      Hepatitis A         Hepatitis B Nasal Spray:       Pneumovax  Saline Nasal Spray     Polio Booster         Tetanus Nausea:       Tuberculosis test or PPD  Vitamin B6 25 mg TID   AVOID:    Dramamine      *Gardasil  Emetrol       Live Poliovirus  Ginger Root 250 mg QID    MMR (measles, mumps &  High Complex Carbs @ Bedtime    rebella)  Sea Bands-Accupressure    Varicella (Chickenpox)  Unisom 1/2 tab TID     *No known complications           If received before Pain:         Known pregnancy;   Darvocet       Resume series after  Lortab        Delivery  Percocet    Yeast:   Tramadol      Femstat  Tylenol 3      Gyne-lotrimin  Ultram       Monistat  Vicodin           MISC:         All Sunscreens           Hair Coloring/highlights          Insect Repellant's          (Including DEET)         Mystic Tans   Third Trimester of Pregnancy  The third trimester is from week 28 through week 40 (months 7 through 9). This trimester is when your unborn baby (fetus) is growing very fast. At the end of the ninth month, the unborn baby is about 20 inches  in length. It weighs about 6-10 pounds. Follow these instructions at home: Medicines  Take over-the-counter and prescription medicines only as told by your doctor. Some medicines are safe and some medicines are not safe during pregnancy.  Take a prenatal vitamin that contains at least 600 micrograms (mcg) of folic acid.  If you have trouble pooping (constipation), take medicine that will make your stool soft (stool softener) if your doctor approves. Eating and drinking   Eat regular, healthy meals.  Avoid raw meat and uncooked cheese.  If you get low calcium from the food you eat, talk to your doctor about taking a daily calcium supplement.  Eat four or five small meals rather than three large meals a day.  Avoid foods that are high in fat and sugars, such as fried and sweet  foods.  To prevent constipation: ? Eat foods that are high in fiber, like fresh fruits and vegetables, whole grains, and beans. ? Drink enough fluids to keep your pee (urine) clear or pale yellow. Activity  Exercise only as told by your doctor. Stop exercising if you start to have cramps.  Avoid heavy lifting, wear low heels, and sit up straight.  Do not exercise if it is too hot, too humid, or if you are in a place of great height (high altitude).  You may continue to have sex unless your doctor tells you not to. Relieving pain and discomfort  Wear a good support bra if your breasts are tender.  Take frequent breaks and rest with your legs raised if you have leg cramps or low back pain.  Take warm water baths (sitz baths) to soothe pain or discomfort caused by hemorrhoids. Use hemorrhoid cream if your doctor approves.  If you develop puffy, bulging veins (varicose veins) in your legs: ? Wear support hose or compression stockings as told by your doctor. ? Raise (elevate) your feet for 15 minutes, 3-4 times a day. ? Limit salt in your food. Safety  Wear your seat belt when driving.  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  Practice driving to the hospital.  Visit the hospital and tour the maternity area.  Talk to your work about taking leave once the baby comes.  Pack your hospital bag.  Prepare the baby's room.  Go to your doctor visits.  Buy a rear-facing car seat. Learn how to install it in your car. General instructions  Do not use hot tubs, steam rooms, or saunas.  Do not use any products that contain nicotine or tobacco, such as cigarettes and e-cigarettes. If you need help quitting, ask your doctor.  Do not drink alcohol.  Do not douche or use tampons or scented sanitary  pads.  Do not cross your legs for long periods of time.  Do not travel for long distances unless you must. Only do so if your doctor says it is okay.  Visit your dentist if you have not gone during your pregnancy. Use a soft toothbrush to brush your teeth. Be gentle when you floss.  Avoid cat litter boxes and soil used by cats. These carry germs that can cause birth defects in the baby and can cause a loss of your baby (miscarriage) or stillbirth.  Keep all your prenatal visits as told by your doctor. This is important. Contact a doctor if:  You are not sure if you are in labor or if your water has broken.  You are dizzy.  You have mild cramps or pressure in your lower belly.  You have a nagging pain in your belly area.  You continue to feel sick to your stomach, you throw up, or you have watery poop.  You have bad smelling fluid coming from your vagina.  You have pain when you pee. Get help right away if:  You have a fever.  You are leaking fluid from your vagina.  You are spotting or bleeding from your vagina.  You have severe belly cramps or pain.  You lose or gain weight quickly.  You have trouble catching your breath and have chest pain.  You notice sudden or extreme puffiness (swelling) of your face, hands, ankles, feet, or legs.  You have not felt the baby move in over an hour.  You have severe headaches that do not go away with medicine.  You have trouble seeing.  You are leaking, or you are having a gush of fluid, from your vagina before you are 37 weeks.  You have regular belly spasms (contractions) before you are 37 weeks. Summary  The third trimester is from week 28 through week 40 (months 7 through 9). This time is when your unborn baby is growing very fast.  Follow your doctor's advice about medicine, food, and activity.  Get ready for the arrival of your baby by taking prenatal classes, getting all the baby items ready, preparing the baby's  room, and visiting your doctor to be checked.  Get help right away if you are bleeding from your vagina, or you have chest pain and trouble catching your breath, or if you have not felt your baby move in over an hour. This information is not intended to replace advice given to you by your health care provider. Make sure you discuss any questions you have with your health care provider. Document Revised: 11/22/2018 Document Reviewed: 09/06/2016 Elsevier Patient Education  [place].

## 2020-07-28 LAB — CBC
Hematocrit: 32.6 % — ABNORMAL LOW (ref 34.0–46.6)
Hemoglobin: 11.3 g/dL (ref 11.1–15.9)
MCH: 31.9 pg (ref 26.6–33.0)
MCHC: 34.7 g/dL (ref 31.5–35.7)
MCV: 92 fL (ref 79–97)
Platelets: 217 10*3/uL (ref 150–450)
RBC: 3.54 x10E6/uL — ABNORMAL LOW (ref 3.77–5.28)
RDW: 12.3 % (ref 11.7–15.4)
WBC: 7.1 10*3/uL (ref 3.4–10.8)

## 2020-07-28 LAB — RPR: RPR Ser Ql: NONREACTIVE

## 2020-07-28 LAB — GLUCOSE, 1 HOUR GESTATIONAL: Gestational Diabetes Screen: 102 mg/dL (ref 65–139)

## 2020-07-30 ENCOUNTER — Telehealth: Payer: Self-pay

## 2020-07-30 NOTE — Telephone Encounter (Signed)
Pt states she noticed her heart was beating a little quick while at work and began to monitor her heart rate. Her heart rate reached 128.  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 Blood pressure was 138/68. Temp of 98. Pt denies any swelling, dizziness, headaches, blurred vision. Positive fetal movement. Staying hydrated. Informed pt to monitor symptoms.If symptoms worsen or persist to let us know. Pt verbalized understanding.

## 2020-07-30 NOTE — Telephone Encounter (Signed)
Patient called in stating that her BP 138/68 and her heart rate is 128.  Patient is concerned and would like a call back.  Could you please advise?

## 2020-08-12 ENCOUNTER — Encounter: Payer: Self-pay | Admitting: Certified Nurse Midwife

## 2020-08-12 ENCOUNTER — Ambulatory Visit (INDEPENDENT_AMBULATORY_CARE_PROVIDER_SITE_OTHER): Payer: BC Managed Care – PPO | Admitting: Certified Nurse Midwife

## 2020-08-12 ENCOUNTER — Other Ambulatory Visit: Payer: Self-pay

## 2020-08-12 VITALS — BP 106/64 | HR 113 | Wt 140.0 lb

## 2020-08-12 DIAGNOSIS — Z3403 Encounter for supervision of normal first pregnancy, third trimester: Secondary | ICD-10-CM

## 2020-08-12 DIAGNOSIS — R002 Palpitations: Secondary | ICD-10-CM

## 2020-08-12 DIAGNOSIS — R000 Tachycardia, unspecified: Secondary | ICD-10-CM

## 2020-08-12 DIAGNOSIS — Z3A31 31 weeks gestation of pregnancy: Secondary | ICD-10-CM

## 2020-08-12 LAB — POCT URINALYSIS DIPSTICK OB
Bilirubin, UA: NEGATIVE
Blood, UA: NEGATIVE
Glucose, UA: NEGATIVE
Ketones, UA: NEGATIVE
Leukocytes, UA: NEGATIVE
Nitrite, UA: NEGATIVE
POC,PROTEIN,UA: NEGATIVE
Spec Grav, UA: 1.01 (ref 1.010–1.025)
Urobilinogen, UA: 0.2 E.U./dL
pH, UA: 7 (ref 5.0–8.0)

## 2020-08-12 NOTE — Patient Instructions (Signed)
Preventive Care 21-25 Years Old, Female Preventive care refers to visits with your health care provider and lifestyle choices that can promote health and wellness. This includes:  A yearly physical exam. This may also be called an annual well check.  Regular dental visits and eye exams.  Immunizations.  Screening for certain conditions.  Healthy lifestyle choices, such as eating a healthy diet, getting regular exercise, not using drugs or products that contain nicotine and tobacco, and limiting alcohol use. What can I expect for my preventive care visit? Physical exam Your health care provider will check your:  Height and weight. This may be used to calculate body mass index (BMI), which tells if you are at a healthy weight.  Heart rate and blood pressure.  Skin for abnormal spots. Counseling Your health care provider may ask you questions about your:  Alcohol, tobacco, and drug use.  Emotional well-being.  Home and relationship well-being.  Sexual activity.  Eating habits.  Work and work environment.  Method of birth control.  Menstrual cycle.  Pregnancy history. What immunizations do I need?  Influenza (flu) vaccine  This is recommended every year. Tetanus, diphtheria, and pertussis (Tdap) vaccine  You may need a Td booster every 10 years. Varicella (chickenpox) vaccine  You may need this if you have not been vaccinated. Human papillomavirus (HPV) vaccine  If recommended by your health care provider, you may need three doses over 6 months. Measles, mumps, and rubella (MMR) vaccine  You may need at least one dose of MMR. You may also need a second dose. Meningococcal conjugate (MenACWY) vaccine  One dose is recommended if you are age 19-21 years and a first-year college student living in a residence hall, or if you have one of several medical conditions. You may also need additional booster doses. Pneumococcal conjugate (PCV13) vaccine  You may need  this if you have certain conditions and were not previously vaccinated. Pneumococcal polysaccharide (PPSV23) vaccine  You may need one or two doses if you smoke cigarettes or if you have certain conditions. Hepatitis A vaccine  You may need this if you have certain conditions or if you travel or work in places where you may be exposed to hepatitis A. Hepatitis B vaccine  You may need this if you have certain conditions or if you travel or work in places where you may be exposed to hepatitis B. Haemophilus influenzae type b (Hib) vaccine  You may need this if you have certain conditions. You may receive vaccines as individual doses or as more than one vaccine together in one shot (combination vaccines). Talk with your health care provider about the risks and benefits of combination vaccines. What tests do I need?  Blood tests  Lipid and cholesterol levels. These may be checked every 5 years starting at age 20.  Hepatitis C test.  Hepatitis B test. Screening  Diabetes screening. This is done by checking your blood sugar (glucose) after you have not eaten for a while (fasting).  Sexually transmitted disease (STD) testing.  BRCA-related cancer screening. This may be done if you have a family history of breast, ovarian, tubal, or peritoneal cancers.  Pelvic exam and Pap test. This may be done every 3 years starting at age 21. Starting at age 30, this may be done every 5 years if you have a Pap test in combination with an HPV test. Talk with your health care provider about your test results, treatment options, and if necessary, the need for more tests.   Follow these instructions at home: Eating and drinking   Eat a diet that includes fresh fruits and vegetables, whole grains, lean protein, and low-fat dairy.  Take vitamin and mineral supplements as recommended by your health care provider.  Do not drink alcohol if: ? Your health care provider tells you not to drink. ? You are  pregnant, may be pregnant, or are planning to become pregnant.  If you drink alcohol: ? Limit how much you have to 0-1 drink a day. ? Be aware of how much alcohol is in your drink. In the [place] one drink equals one 12 oz bottle of beer (355 mL), one 5 oz glass of wine (148 mL), or one 1 oz glass of hard liquor (44 mL). Lifestyle  Take daily care of your teeth and gums.  Stay active. Exercise for at least 30 minutes on 5 or more days each week.  Do not use any products that contain nicotine or tobacco, such as cigarettes, e-cigarettes, and chewing tobacco. If you need help quitting, ask your health care provider.  If you are sexually active, practice safe sex. Use a condom or other form of birth control (contraception) in order to prevent pregnancy and STIs (sexually transmitted infections). If you plan to become pregnant, see your health care provider for a preconception visit. What's next?  Visit your health care provider once a year for a well check visit.  Ask your health care provider how often you should have your eyes and teeth checked.  Stay up to date on all vaccines. This information is not intended to replace advice given to you by your health care provider. Make sure you discuss any questions you have with your health care provider. Document Revised: 04/12/2018 Document Reviewed: 04/12/2018 Elsevier Patient Education  2020 [employer].

## 2020-08-12 NOTE — Progress Notes (Signed)
Pt present for routine prenatal visit. C/o rapid heart rate 120 bpm

## 2020-08-12 NOTE — Progress Notes (Signed)
ROB doing well. C/o palptions and episodes of tachycardia. States that she talked to Clymer last visit about feeling palpitations ( heart was beating harder) but notes recently experiencing episode of tachycardia ( HR 120). She deneis SOB, chest pains, and dizziness. Referral placed for cardiology consults. Warning signs and symptoms reviewed. Follow up 2 wk or sooner if needed with [facility].   Doreene Burke, CNM

## 2020-08-15 NOTE — L&D Delivery Note (Addendum)
Delivery Note  1735 Dr Valentino Saxon in room for evaluation of patient, SVE: 10/100/0, vertex. Caput present. Verbal consent obtained for vacuum assisted vaginal birth.   Bell kiwi vacuum applied by Dr. Valentino Saxon at 1746, pressure applied at Linda started and released after each contraction. Descent of fetal head noted.   Spontaneous vaginal birth of liveborn female infant in right occiput anterior position at 1801 in to hands of Linda Mendez, SNM. Single loose nuchal cord reduced on perineum. Infant immediately to maternal abdomen. Delayed cord clamping, skin to skin. and three (3) vessel. APGAR: 7, 9. Weight pending. NNP and receiving nurse present at bedside for birth.   Pitocin bolus infusing, see MAR. Spontaneous delivery of intact placenta at 1807. Uterus firm. Second degree perineal and vaginal laceration repaired by Dr. Valentino Saxon with 3-0 vicryl rapide under epidural and local anesthesia. Laceration hemostatic and well approximated. Uterus firm. Rubra small/moderate. QBL pending.   800 mcg cytotec placed rectal due to prolonged labor including second stage.   Initiate routine postpartum Mendez. Mom to postpartum.  Baby to Couplet Mendez / Skin to Skin.  FOB present at bedside and overjoyed with Linda birth of "Linda Mendez".    Linda Mendez, CNM [facility], Linda Mendez 10/19/2020, 6:19 PM

## 2020-08-24 ENCOUNTER — Telehealth: Payer: Self-pay

## 2020-08-24 NOTE — Telephone Encounter (Signed)
Patient called in stating that she was referred to a cardiologist however the office she was referred to is out of network for her insurance company. Patient called in requesting that she be referred elsewhere.  Could you please advise?

## 2020-08-25 ENCOUNTER — Telehealth: Payer: Self-pay

## 2020-08-25 NOTE — Telephone Encounter (Signed)
mychart message sent to patient- call insurance company to find an approved provider and then let us know who to send the referral to.

## 2020-08-26 ENCOUNTER — Ambulatory Visit: Payer: BC Managed Care – PPO | Admitting: Internal Medicine

## 2020-08-26 ENCOUNTER — Other Ambulatory Visit: Payer: Self-pay

## 2020-08-26 ENCOUNTER — Encounter: Payer: Self-pay | Admitting: Internal Medicine

## 2020-08-26 ENCOUNTER — Telehealth: Payer: Self-pay

## 2020-08-26 VITALS — BP 108/72 | HR 82 | Ht 63.0 in | Wt 144.0 lb

## 2020-08-26 DIAGNOSIS — R002 Palpitations: Secondary | ICD-10-CM

## 2020-08-26 DIAGNOSIS — R000 Tachycardia, unspecified: Secondary | ICD-10-CM

## 2020-08-26 DIAGNOSIS — R55 Syncope and collapse: Secondary | ICD-10-CM

## 2020-08-26 NOTE — Patient Instructions (Signed)
Please call or MyChart Message our office if you want to proceed with any testing (labs, ZIO heart monitor).   Medication Instructions:  Your physician recommends that you continue on your current medications as directed. Please refer to the Current Medication list given to you today.  *If you need a refill on your cardiac medications before your next appointment, please call your pharmacy*  Follow-Up: At [facility], you and your health needs are our priority.  As part of our continuing mission to provide you with exceptional heart care, we have created designated Provider Care Teams.  These Care Teams include your primary Cardiologist (physician) and Advanced Practice Providers (APPs -  Physician Assistants and Nurse Practitioners) who all work together to provide you with the care you need, when you need it.  We recommend signing up for the patient portal called "MyChart".  Sign up information is provided on this After Visit Summary.  MyChart is used to connect with patients for Virtual Visits (Telemedicine).  Patients are able to view lab/test results, encounter notes, upcoming appointments, etc.  Non-urgent messages can be sent to your provider as well.   To learn more about what you can do with MyChart, go to [email or web address].    Your next appointment:   1 month(s)  The format for your next appointment:   In Person  Provider:   You may see DR Cristal Deer END or one of the following Advanced Practice Providers on your designated Care Team:    Nicolasa Ducking, NP  Eula Listen, PA-C  Marisue Ivan, PA-C  Cadence [place], [place]  Gillian Shields, NP

## 2020-08-26 NOTE — Progress Notes (Signed)
New Outpatient Visit Date: 08/26/2020  Referring Provider: Doreene Burke, CNM [address] [place],  [place] [address]  Chief Complaint: Palpitations  HPI:  Ms. Marken is a 25 y.o. pregnant female (G1P0, [redacted]w[redacted]d) who is being seen today for the evaluation of Picardi and palpitations at the request of Ms. Janee Morn. She has no significant past medical history.  About a month ago, Ms. Alviar began experiencing intermittent palpitations with elevated heart rates.  The first episode was associated with significant fatigue that lasted most of the day.  For about 8 hours, she felt like her heart rate was elevated, up to 120 bpm.  She noted "light palpitations" with this.  There were no associated symptoms including chest pain, shortness of breath, and lightheadedness.  She has had 2-3 additional episodes of elevated heart rates since then, though she did not have accompanying fatigue.  Over the last week, Ms. Dusek has felt back to normal without any further palpitations or elevated heart rates.  Ms. Ziesmer reports that she saw a cardiologist during adolescence due to chest discomfort that was ultimately attributed to anxiety.  Other than multiple EKGs, she does not believe any additional testing was performed.  She has not felt anything like this for several years.  She notes one episode of syncope about 1.5 years ago when she stood up rapidly to hug her husband and became lightheaded.  She believes that she blacked out for about 5 seconds.  She notes occasional orthostatic lightheadedness now but has not passed out again.  She has experienced mild exertional dyspnea as her pregnancy has progressed.  She denies chest pain, orthopnea, PND, and edema.  --------------------------------------------------------------------------------------------------  Cardiovascular History & Procedures: Cardiovascular Problems:  Tachycardia and palpitations  Risk  Factors:  None  Cath/PCI:  None  CV Surgery:  None  EP Procedures and Devices:  None  Non-Invasive Evaluation(s):  None  Recent CV Pertinent Labs: Lab Results  Component Value Date   CHOL 134 09/12/2017   HDL 55 09/12/2017   LDLCALC 69 09/12/2017   TRIG 49 09/12/2017   CHOLHDL 2.4 09/12/2017   K 3.5 12/11/2018   BUN 16 12/11/2018   BUN 15 09/12/2017   CREATININE 0.56 12/11/2018    --------------------------------------------------------------------------------------------------  Past Medical History:  Diagnosis Date  . Arm fracture, left   . Hemorrhoids   . Mononucleosis     Past Surgical History:  Procedure Laterality Date  . arm surgery    . blocked tear duct surgery    . FRACTURE SURGERY Left 2003   Left arm     Current Meds  Medication Sig  . Misc. Devices (BREAST PUMP) MISC Dispense one breast pump for patient  . Prenatal Vit-Fe Fumarate-FA (PRENATAL VITAMIN PO) Take by mouth daily.    Allergies: Amoxicillin and Penicillins  Social History   Tobacco Use  . Smoking status: Never Smoker  . Smokeless tobacco: Never Used  Vaping Use  . Vaping Use: Never used  Substance Use Topics  . Alcohol use: Not Currently    Comment: Rarely drank before pregnancy.  . Drug use: No    Family History  Problem Relation Age of Onset  . Thyroid disease Mother   . Migraines Mother   . Hypersomnolence Mother   . Hyperlipidemia Father   . Hyperlipidemia Paternal Grandfather   . Skin cancer Maternal Grandmother   . Skin cancer Paternal Grandmother     Review of Systems: A 12-system review of systems was performed and was  negative except as noted in the HPI.  --------------------------------------------------------------------------------------------------  Physical Exam: BP 108/72   Pulse 82   Ht 5\' 3"  (1.6 m)   Wt 144 lb (65.3 kg)   LMP 01/03/2020   BMI 25.51 kg/m   General: NAD.  Accompanied by her husband. HEENT: No conjunctival pallor or  scleral icterus. Facemask in place. Neck: Supple without lymphadenopathy, thyromegaly, JVD, or HJR. No carotid bruit. Lungs: Normal work of breathing. Clear to auscultation bilaterally without wheezes or crackles. Heart: Regular rate and rhythm without murmurs, rubs, or gallops. Non-displaced PMI. Abd: Bowel sounds present. Soft, NT/ND without hepatosplenomegaly.  Gravid uterus noted. Ext: No lower extremity edema. Radial, PT, and DP pulses are 2+ bilaterally Skin: Warm and dry without rash. Neuro: CNIII-XII intact. Strength and fine-touch sensation intact in upper and lower extremities bilaterally. Psych: Normal mood and affect.  EKG: Normal sinus rhythm without abnormality (heart rate 82 bpm)  Lab Results  Component Value Date   WBC 7.1 07/27/2020   HGB 11.3 07/27/2020   HCT 32.6 (L) 07/27/2020   MCV 92 07/27/2020   PLT 217 07/27/2020    Lab Results  Component Value Date   NA 139 12/11/2018   K 3.5 12/11/2018   CL 105 12/11/2018   CO2 25 12/11/2018   BUN 16 12/11/2018   CREATININE 0.56 12/11/2018   GLUCOSE 96 12/11/2018   ALT 21 09/12/2017    Lab Results  Component Value Date   CHOL 134 09/12/2017   HDL 55 09/12/2017   LDLCALC 69 09/12/2017   TRIG 49 09/12/2017   CHOLHDL 2.4 09/12/2017    --------------------------------------------------------------------------------------------------  ASSESSMENT AND PLAN: Palpitations and tachycardia: Ms. Hefter reports a few episodes of elevated heart rates with light palpitations over the last month.  There have not been any worrisome associated symptoms, such as chest pain, dyspnea, and lightheadedness/syncope.  Her examination today and EKG are normal.  Notably, her resting heart rate is in the low 80s, which is appropriate.  We discussed further evaluation with ambulatory cardiac monitoring as well as labs (BMP, magnesium, and TSH).  However, given that her symptoms seem to have resolved over the last week, Ms. Mcneely wishes to  defer this for now.  I have encouraged her to stay well-hydrated in the setting of occasional orthostatic lightheadedness.  She will reach out to Sherron Flemings if symptoms return and she wishes to proceed with additional testing.  Follow-up: Return to clinic in 1 month to reassess symptoms.  [place], MD 08/27/2020 7:40 AM

## 2020-08-26 NOTE — Telephone Encounter (Signed)
mychart message sent to patient re: no visitors 

## 2020-08-27 ENCOUNTER — Ambulatory Visit (INDEPENDENT_AMBULATORY_CARE_PROVIDER_SITE_OTHER): Payer: BC Managed Care – PPO | Admitting: Certified Nurse Midwife

## 2020-08-27 ENCOUNTER — Encounter: Payer: Self-pay | Admitting: Internal Medicine

## 2020-08-27 VITALS — BP 107/62 | HR 84 | Wt 144.1 lb

## 2020-08-27 DIAGNOSIS — Z3403 Encounter for supervision of normal first pregnancy, third trimester: Secondary | ICD-10-CM

## 2020-08-27 DIAGNOSIS — Z3A33 33 weeks gestation of pregnancy: Secondary | ICD-10-CM

## 2020-08-27 DIAGNOSIS — R000 Tachycardia, unspecified: Secondary | ICD-10-CM | POA: Insufficient documentation

## 2020-08-27 NOTE — Progress Notes (Signed)
I have seen, interviewed, and examined the patient in conjunction with the [employer] and affirm the diagnosis and management plan.   Gunnar Bulla, CNM [facility], [facility] 08/27/20 4:57 PM

## 2020-08-27 NOTE — Progress Notes (Signed)
ROB-Reports indigestion at night time that is relieved with home remedies. Cardiology consult completed yesterday, see note. Desires waterbirth, will send supply information via MyChart. Discussed ARRIVE study. Advised that routine elective induction of labor was not offered by CNMs. Anticipatory guidance regarding course of prenatal care. Reviewed red flag symptoms and when to call. ROB x 2 weeks with ANNIE or sooner if needed.  Juliann Pares, Student-MidWife [employer] 08/27/20 4:16 PM

## 2020-08-27 NOTE — Patient Instructions (Signed)
Heartburn During Pregnancy  Heartburn is a type of pain or discomfort in the throat or chest. It may cause a burning feeling. It happens when stomach acid backs up into the part of the body that moves food from your mouth to your stomach (esophagus). This condition is also called acid reflux. Heartburn is common during pregnancy. It usually goes away or gets better after giving birth. What are the causes? This condition is caused by stomach acid that backs up into the part of the body that moves food from your mouth to your stomach. Acid can back up because of: Changing amounts of hormones in the body. Large meals. Certain foods and drinks. Exercise. More acid being made in the stomach. What increases the risk? You are more likely to develop this condition if: You had heartburn before you became pregnant. You have been pregnant more than once before. You are overweight or obese. Heartburn is also likely to happen as you get further along in your pregnancy. The risk is higher in the last 3 months before birth (third trimester). What are the signs or symptoms? Symptoms of this condition include: Burning pain in the chest or lower throat. A bitter taste in the mouth. Coughing. Problems swallowing. Vomiting. A hoarse voice. Asthma. Symptoms may get worse when you lie down or bend over. You may feel worse at night. How is this treated? Treatment for this condition depends on how bad your symptoms are. Your doctor may ask you to: Take over-the-counter medicines for mild heartburn. These medicines include antacids or acid reducers. Take prescription medicines to reduce stomach acid or to protect your stomach. Change your diet. Raise the head of your bed so it is higher than the foot of the bed. Follow these instructions at home: Eating and drinking Do not drink alcohol while you are pregnant. Learn which foods and drinks make you feel worse, and avoid them. Eat small meals often, instead  of large meals. Avoid drinking a lot of liquid with your meals. Avoid eating meals during the 2-3 hours before you go to bed. Avoid lying down right after you eat. Do not exercise right after you eat. Drinks to avoid Coffee and tea (with or without caffeine). Energy drinks and sports drinks. Carbonated drinks or sodas. Citrus fruit juices. Foods to avoid Chocolate and cocoa. Peppermint and mint flavorings. Garlic, onions, and horseradish. Spicy foods and foods that have a lot of acid in them. These include peppers, chili powder, curry powder, vinegar, hot sauces, and barbecue sauce. Citrus fruits, such as oranges, lemons, and limes. Tomato-based foods, such as red sauce, chili, and salsa. Fried and fatty foods, such as donuts, french fries, potato chips, and high-fat dressings. High-fat meats, such as hot dogs, precooked or cured meats, sausage, ham, and bacon. High-fat dairy items, such as whole milk, butter, and cheese. Medicines Take over-the-counter and prescription medicines only as told by your doctor. Do not take aspirin or NSAIDs, such as ibuprofen, unless your doctor tells you to do that. Your doctor may tell you to avoid medicines that have sodium bicarbonate in them. General instructions If told, raise the head of your bed about 6 inches (15 cm). You can do this by putting blocks under the legs. Sleeping with more pillows does not help with heartburn. Do not use any products that contain nicotine or tobacco, such as cigarettes, e-cigarettes, and chewing tobacco. If you need help quitting, ask your doctor. Wear loose-fitting clothing. Try to lower your stress, such as with   more pillows does not help with heartburn.  Do not use any products that contain nicotine or tobacco, such as cigarettes, e-cigarettes, and chewing tobacco. If you need help quitting, ask your doctor.  Wear loose-fitting clothing.  Try to lower your stress, such as with yoga or meditation. If you need help, ask your doctor.  Stay at a healthy weight. If you are overweight, work with your doctor to safely manage your weight.  Keep all follow-up visits as told by your doctor. This  is important. Where to find more information  American Pregnancy Association: americanpregnancy.org Contact a doctor if:  You get new symptoms.  Your symptoms do not get better with treatment.  You lose weight and you do not know why.  You have trouble swallowing.  You make loud sounds when you breathe (wheeze).  You have a cough that does not go away.  You have heartburn often for more than 2 weeks.  You feel like you may vomit (nausea), or you vomit, and this does not get better with treatment.  You have pain in your belly (abdomen). Get help right away if:  You have very bad chest pain that spreads to your arm, neck, or jaw.  You feel sweaty, dizzy, or light-headed.  You have trouble breathing.  You have pain when swallowing.  You vomit, and your vomit looks like blood or coffee grounds.  Your poop (stool) is bloody or black. Summary  Heartburn in pregnancy is common, especially during the last 3 months before birth.  This condition is caused by stomach acid backing up into the part of the body that moves food from the mouth to the stomach.  This condition can be treated with medicines, changes to your diet, or raising the head of your bed.  Contact a doctor if your symptoms do not go away or you get new symptoms. This information is not intended to replace advice given to you by your health care provider. Make sure you discuss any questions you have with your health care provider. Document Revised: 04/24/2019 Document Reviewed: 04/24/2019 Elsevier Patient Education  2021 Elsevier Inc.    Rosen's Emergency Medicine: Concepts and Clinical Practice (9th ed., pp. 2296- 2312). Elsevier.">  Braxton Hicks Contractions Contractions of the uterus can occur throughout pregnancy, but they are not always a sign that you are in labor. You may have practice contractions called Braxton Hicks contractions. These false labor contractions are sometimes confused with true  labor. What are Deberah Pelton contractions? Braxton Hicks contractions are tightening movements that occur in the muscles of the uterus before labor. Unlike true labor contractions, these contractions do not result in opening (dilation) and thinning of the cervix. Toward the end of pregnancy (32-34 weeks), Braxton Hicks contractions can happen more often and may become stronger. These contractions are sometimes difficult to tell apart from true labor because they can be very uncomfortable. You should not feel embarrassed if you go to the hospital with false labor. Sometimes, the only way to tell if you are in true labor is for your health care provider to look for changes in the cervix. The health care provider will do a physical exam and may monitor your contractions. If you are not in true labor, the exam should show that your cervix is not dilating and your water has not broken. If there are no other health problems associated with your pregnancy, it is completely safe for you to be sent home with false labor. You may continue to  have Braxton Hicks contractions until you go into true labor. How to tell the difference between true labor and false labor True labor  Contractions last 30-70 seconds.  Contractions become very regular.  Discomfort is usually felt in the top of the uterus, and it spreads to the lower abdomen and low back.  Contractions do not go away with walking.  Contractions usually become more intense and increase in frequency.  The cervix dilates and gets thinner. False labor  Contractions are usually shorter and not as strong as true labor contractions.  Contractions are usually irregular.  Contractions are often felt in the front of the lower abdomen and in the groin.  Contractions may go away when you walk around or change positions while lying down.  Contractions get weaker and are shorter-lasting as time goes on.  The cervix usually does not dilate or become  thin. Follow these instructions at home:  Take over-the-counter and prescription medicines only as told by your health care provider.  Keep up with your usual exercises and follow other instructions from your health care provider.  Eat and drink lightly if you think you are going into labor.  If Braxton Hicks contractions are making you uncomfortable: ? Change your position from lying down or resting to walking, or change from walking to resting. ? Sit and rest in a tub of warm water. ? Drink enough fluid to keep your urine pale yellow. Dehydration may cause these contractions. ? Do slow and deep breathing several times an hour.  Keep all follow-up prenatal visits as told by your health care provider. This is important.   Contact a health care provider if:  You have a fever.  You have continuous pain in your abdomen. Get help right away if:  Your contractions become stronger, more regular, and closer together.  You have fluid leaking or gushing from your vagina.  You pass blood-tinged mucus (bloody show).  You have bleeding from your vagina.  You have low back pain that you never had before.  You feel your baby's head pushing down and causing pelvic pressure.  Your baby is not moving inside you as much as it used to. Summary  Contractions that occur before labor are called Braxton Hicks contractions, false labor, or practice contractions.  Braxton Hicks contractions are usually shorter, weaker, farther apart, and less regular than true labor contractions. True labor contractions usually become progressively stronger and regular, and they become more frequent.  [facility] from [facility] by changing position, resting in a warm bath, drinking plenty of water, or practicing deep breathing. This information is not intended to replace advice given to you by your health care provider. Make sure you discuss any questions you have with your health care  provider. Document Revised: 07/14/2017 Document Reviewed: 12/15/2016 Elsevier Patient Education  2021 Elsevier Inc.    Fetal Movement Counts Patient Name: ________________________________________________ Patient Due Date: ____________________  What is a fetal movement count? A fetal movement count is the number of times that you feel your baby move during a certain amount of time. This may also be called a fetal kick count. A fetal movement count is recommended for every pregnant woman. You may be asked to start counting fetal movements as early as week 28 of your pregnancy. Pay attention to when your baby is most active. You may notice your baby's sleep and wake cycles. You may also notice things that make your baby move more. You should do a fetal movement  count:  When your baby is normally most active.  At the same time each day. A good time to count movements is while you are resting, after having something to eat and drink. How do I count fetal movements? 1. Find a quiet, comfortable area. Sit, or lie down on your side. 2. Write down the date, the start time and stop time, and the number of movements that you felt between those two times. Take this information with you to your health care visits. 3. Write down your start time when you feel the first movement. 4. Count kicks, flutters, swishes, rolls, and jabs. You should feel at least 10 movements. 5. You may stop counting after you have felt 10 movements, or if you have been counting for 2 hours. Write down the stop time. 6. If you do not feel 10 movements in 2 hours, contact your health care provider for further instructions. Your health care provider may want to do additional tests to assess your baby's well-being. Contact a health care provider if:  You feel fewer than 10 movements in 2 hours.  Your baby is not moving like he or she usually does. Date: ____________ Start time: ____________ Stop time: ____________ Movements:  ____________ Date: ____________ Start time: ____________ Stop time: ____________ Movements: ____________ Date: ____________ Start time: ____________ Stop time: ____________ Movements: ____________ Date: ____________ Start time: ____________ Stop time: ____________ Movements: ____________ Date: ____________ Start time: ____________ Stop time: ____________ Movements: ____________ Date: ____________ Start time: ____________ Stop time: ____________ Movements: ____________ Date: ____________ Start time: ____________ Stop time: ____________ Movements: ____________ Date: ____________ Start time: ____________ Stop time: ____________ Movements: ____________ Date: ____________ Start time: ____________ Stop time: ____________ Movements: ____________ This information is not intended to replace advice given to you by your health care provider. Make sure you discuss any questions you have with your health care provider. Document Revised: 03/21/2019 Document Reviewed: 03/21/2019 Elsevier Patient Education  2021 [employer]    Third Trimester of Pregnancy  The third trimester of pregnancy is from week 28 through week 40. This is also called months 7 through 9. This trimester is when your unborn baby (fetus) is growing very fast. At the end of the ninth month, the unborn baby is about 20 inches long. It weighs about 6-10 pounds. Body changes during your third trimester Your body continues to go through many changes during this time. The changes vary and generally return to normal after the baby is born. Physical changes  Your weight will continue to increase. You may gain 25-35 pounds (11-16 kg) by the end of the pregnancy. If you are underweight, you may gain 28-40 lb (about 13-18 kg). If you are overweight, you may gain 15-25 lb (about 7-11 kg).  You may start to get stretch marks on your hips, belly (abdomen), and breasts.  Your breasts will continue to grow and may hurt. A yellow fluid (colostrum)  may leak from your breasts. This is the first milk you are making for your baby.  You may have changes in your hair.  Your belly button may stick out.  You may have more swelling in your hands, face, or ankles. Health changes  You may have heartburn.  You may have trouble pooping (constipation).  You may get hemorrhoids. These are swollen veins in the butt that can itch or get painful.  You may have swollen veins (varicose veins) in your legs.  You may have more body aches in the pelvis, back, or thighs.  You  may have more tingling or numbness in your hands, arms, and legs. The skin on your belly may also feel numb.  You may feel short of breath as your womb (uterus) gets bigger. Other changes  You may pee (urinate) more often.  You may have more problems sleeping.  You may notice the unborn baby "dropping," or moving lower in your belly.  You may have more discharge coming from your vagina.  Your joints may feel loose, and you may have pain around your pelvic bone. Follow these instructions at home: Medicines  Take over-the-counter and prescription medicines only as told by your doctor. Some medicines are not safe during pregnancy.  Take a prenatal vitamin that contains at least 600 micrograms (mcg) of folic acid. Eating and drinking  Eat healthy meals that include: ? Fresh fruits and vegetables. ? Whole grains. ? Good sources of protein, such as meat, eggs, or tofu. ? Low-fat dairy products.  Avoid raw meat and unpasteurized juice, milk, and cheese. These carry germs that can harm you and your baby.  Eat 4 or 5 small meals rather than 3 large meals a day.  You may need to take these actions to prevent or treat trouble pooping: ? Drink enough fluids to keep your pee (urine) pale yellow. ? Eat foods that are high in fiber. These include beans, whole grains, and fresh fruits and vegetables. ? Limit foods that are high in fat and sugar. These include fried or sweet  foods. Activity  Exercise only as told by your doctor. Stop exercising if you start to have cramps in your womb.  Avoid heavy lifting.  Do not exercise if it is too hot or too humid, or if you are in a place of great height (high altitude).  If you choose to, you may have sex unless your doctor tells you not to. Relieving pain and discomfort  Take breaks often, and rest with your legs raised (elevated) if you have leg cramps or low back pain.  Take warm water baths (sitz baths) to soothe pain or discomfort caused by hemorrhoids. Use hemorrhoid cream if your doctor approves.  Wear a good support bra if your breasts are tender.  If you develop bulging, swollen veins in your legs: ? Wear support hose as told by your doctor. ? Raise your feet for 15 minutes, 3-4 times a day. ? Limit salt in your food. Safety  Talk to your doctor before traveling far distances.  Do not use hot tubs, steam rooms, or saunas.  Wear your seat belt at all times when you are in a car.  Talk with your doctor if someone is hurting you or yelling at you a lot. Preparing for your baby's arrival To prepare for the arrival of your baby:  Take prenatal classes.  Visit the hospital and tour the maternity area.  Buy a rear-facing car seat. Learn how to install it in your car.  Prepare the baby's room. Take out all pillows and stuffed animals from the baby's crib. General instructions  Avoid cat litter boxes and soil used by cats. These carry germs that can cause harm to the baby and can cause a loss of your baby by miscarriage or stillbirth.  Do not douche or use tampons. Do not use scented sanitary pads.  Do not smoke or use any products that contain nicotine or tobacco. If you need help quitting, ask your doctor.  Do not drink alcohol.  Do not use herbal medicines, illegal drugs, or  medicines that were not approved by your doctor. Chemicals in these products can affect your baby.  Keep all  follow-up visits. This is important. Where to find more information  American Pregnancy Association: americanpregnancy.org  [employer]: www.acog.org  Office on Women's Health: [employer] Contact a doctor if:  You have a fever.  You have mild cramps or pressure in your lower belly.  You have a nagging pain in your belly area.  You vomit, or you have watery poop (diarrhea).  You have bad-smelling fluid coming from your vagina.  You have pain when you pee, or your pee smells bad.  You have a headache that does not go away when you take medicine.  You have changes in how you see, or you see spots in front of your eyes. Get help right away if:  Your water breaks.  You have regular contractions that are less than 5 minutes apart.  You are spotting or bleeding from your vagina.  You have very bad belly cramps or pain.  You have trouble breathing.  You have chest pain.  You faint.  You have not felt the baby move for the amount of time told by your doctor.  You have new or increased pain, swelling, or redness in an arm or leg. Summary  The third trimester is from week 28 through week 40 (months 7 through 9). This is the time when your unborn baby is growing very fast.  During this time, your discomfort may increase as you gain weight and as your baby grows.  Get ready for your baby to arrive by taking prenatal classes, buying a rear-facing car seat, and preparing the baby's room.  Get help right away if you are bleeding from your vagina, you have chest pain and trouble breathing, or you have not felt the baby move for the amount of time told by your doctor. This information is not intended to replace advice given to you by your health care provider. Make sure you discuss any questions you have with your health care provider. Document Revised: 01/08/2020 Document Reviewed: 11/14/2019 Elsevier Patient Education   2021 [employer].

## 2020-09-07 ENCOUNTER — Other Ambulatory Visit: Payer: Self-pay

## 2020-09-07 ENCOUNTER — Telehealth: Payer: Self-pay

## 2020-09-07 ENCOUNTER — Ambulatory Visit (INDEPENDENT_AMBULATORY_CARE_PROVIDER_SITE_OTHER): Payer: BC Managed Care – PPO | Admitting: Certified Nurse Midwife

## 2020-09-07 DIAGNOSIS — Z3A35 35 weeks gestation of pregnancy: Secondary | ICD-10-CM

## 2020-09-07 NOTE — Progress Notes (Signed)
Received a transferred call from Linda Mendez for a televisit.DOB as identifier. Patient states she feels a little better. States she has good fetal movement. States she does not have access to a BP cuff or scale. Call transferred to Doreene Burke CNM for completion of televisit.

## 2020-09-07 NOTE — Patient Instructions (Signed)

## 2020-09-07 NOTE — Telephone Encounter (Signed)
Pt called in and stated that she has Covid symptoms,  the pt said that she is waiting to get her results. I told her that we will turn her visit into a televisit and I will let the nurse know.

## 2020-09-07 NOTE — Progress Notes (Signed)
Virtual Visit via Telephone Note  I connected with Linda Mendez on 09/07/20 at  3:45 PM EST by telephone and verified that I am speaking with the correct person using two identifiers.  Location: Patient: at home Provider: at office   I discussed the limitations, risks, security and privacy concerns of performing an evaluation and management service by telephone and the availability of in person appointments. I also discussed with the patient that there may be a patient responsible charge related to this service. The patient expressed understanding and agreed to proceed.   History of Present Illness:    Observations/Objective: Pt 24 yr old G1 p0 35.3 ROB completing tele viist due to not feeling well.   Assessment and Plan: She has sore throat, congestion, runny nose and headache, denies fever. Did COVID home test negative, has has PCR test waiting for results. Discussed red flag symptoms, fever, SOB, decreased fetal movement. Pt encouraged to go to ED for any red flag symptoms. She feels good fetal movement.   Follow Up Instructions: She will call for follow up once she gets her results from PCR testing.    I discussed the assessment and treatment plan with the patient. The patient was provided an opportunity to ask questions and all were answered. The patient agreed with the plan and demonstrated an understanding of the instructions.   The patient was advised to call back or seek an in-person evaluation if the symptoms worsen or if the condition fails to improve as anticipated.  I provided 7 minutes of non-face-to-face time during this encounter.   Doreene Burke, CNM

## 2020-09-08 ENCOUNTER — Telehealth: Payer: Self-pay

## 2020-09-08 NOTE — Telephone Encounter (Signed)
Patient was called and scheduled.

## 2020-09-08 NOTE — Telephone Encounter (Signed)
Patient called in stating that her COVID test results were indeed positive, patient stated that Pattricia Boss had informed her to return in a week if her test results were negative. How should we go about scheduling this patient now that we know she is positive for COVID? Unsure if Pattricia Boss had anything specific in mind.  Thank you.

## 2020-09-08 NOTE — Telephone Encounter (Signed)
Please schedule her for tele visit , she will be due to see Marcelino Duster

## 2020-09-12 ENCOUNTER — Encounter: Payer: Self-pay | Admitting: Internal Medicine

## 2020-09-14 ENCOUNTER — Telehealth (INDEPENDENT_AMBULATORY_CARE_PROVIDER_SITE_OTHER): Payer: BC Managed Care – PPO | Admitting: Internal Medicine

## 2020-09-14 DIAGNOSIS — U071 COVID-19: Secondary | ICD-10-CM

## 2020-09-14 DIAGNOSIS — Z3A37 37 weeks gestation of pregnancy: Secondary | ICD-10-CM | POA: Diagnosis not present

## 2020-09-14 NOTE — Progress Notes (Signed)
Patient ID: Linda Mendez, female   DOB: 15-Aug-1996, 25 y.o.   MRN: [id number]   Virtual Visit via video Note  This visit type was conducted due to national recommendations for restrictions regarding the COVID-19 pandemic (e.g. social distancing).  This format is felt to be most appropriate for this patient at this time.  All issues noted in this document were discussed and addressed.  No physical exam was performed (except for noted visual exam findings with Video Visits).   I connected with Linda Mendez by a video enabled telemedicine application and verified that I am speaking with the correct person using two identifiers. Location patient: home Location provider: work Persons participating in the virtual visit: patient, provider  The limitations, risks, security and privacy concerns of performing an evaluation and management service by video and the availability of in person appointments have been discussed.  It has also been discussed with the patient that there may be a patient responsible charge related to this service. The patient expressed understanding and agreed to proceed.   Reason for visit: work in appt  HPI: Work in for covid follow up.  [redacted] weeks pregnant. She first noticed symptoms 09/05/20.  Noticed scratchy throat and decreased appetite.  Smell change.  The following day - increased sore throat and cough.  On 09/06/20 - headache.  Home test negative.  Took PCR test 09/06/20.  Returned positive 09/08/20.  Has been home from work. Developed fatigue and increased congestion and runny nose.  Started claritin.  This helped with the cough and runny nose.  Had persistent cough and congestion which improved throughout this past weekend.  Appetite is better.  No vomiting or diarrhea.  Using neti pot.  Off claritin now.  Does still report increased fatigue.  Discussed work return. Pregnancy going well.  OB aware of above.  Has f/u planned end of week.    ROS: See pertinent positives and  negatives per HPI.  Past Medical History:  Diagnosis Date  . Arm fracture, left   . Hemorrhoids   . Mononucleosis     Past Surgical History:  Procedure Laterality Date  . arm surgery    . blocked tear duct surgery    . FRACTURE SURGERY Left 2003   Left arm     Family History  Problem Relation Age of Onset  . Thyroid disease Mother   . Migraines Mother   . Hypersomnolence Mother   . Hyperlipidemia Father   . Hyperlipidemia Paternal Grandfather   . Skin cancer Maternal Grandmother   . Skin cancer Paternal Grandmother     SOCIAL HX: reviewed.    Current Outpatient Medications:  .  calcium carbonate (TUMS - DOSED IN MG ELEMENTAL CALCIUM) 500 MG chewable tablet, Chew 1 tablet by mouth daily., Disp: , Rfl:  .  Misc. Devices (BREAST PUMP) MISC, Dispense one breast pump for patient, Disp: 1 each, Rfl: 0 .  Prenatal Vit-Fe Fumarate-FA (PRENATAL VITAMIN PO), Take by mouth daily., Disp: , Rfl:   EXAM:  GENERAL: alert, oriented, appears well and in no acute distress  HEENT: atraumatic, conjunttiva clear, no obvious abnormalities on inspection of external nose and ears  NECK: normal movements of the head and neck  LUNGS: on inspection no signs of respiratory distress, breathing rate appears normal, no obvious gross SOB, gasping or wheezing  CV: no obvious cyanosis  PSYCH/NEURO: pleasant and cooperative, no obvious depression or anxiety, speech and thought processing grossly intact  ASSESSMENT AND PLAN:  Discussed the following assessment  and plan:  Problem List Items Addressed This Visit    COVID-19 virus infection    Diagnosed with covid - PCR positive - 09/06/20.  Symptoms started 09/05/20.  Sore throat, cough, congestion has improved.  Still with fatigue.  Not on any medication now.  Using neti pot.  Appetite is better.  Given persistent increased fatigue and given [redacted] weeks pregnant, will have her remain out of work for a few more days. Discussed plan to return to work  09/17/20.  Note for work provided. Follow symptoms.  Call with update.        Pregnant    Pregnancy going well.  OB aware of covid.  Has f/u planned end of week.            I discussed the assessment and treatment plan with the patient. The patient was provided an opportunity to ask questions and all were answered. The patient agreed with the plan and demonstrated an understanding of the instructions.   The patient was advised to call back or seek an in-person evaluation if the symptoms worsen or if the condition fails to improve as anticipated.   Dale Seagraves, MD

## 2020-09-14 NOTE — Telephone Encounter (Signed)
Do you want to add her for a virtual at 4?

## 2020-09-14 NOTE — Telephone Encounter (Signed)
Yes

## 2020-09-15 ENCOUNTER — Telehealth: Payer: Self-pay | Admitting: Internal Medicine

## 2020-09-15 ENCOUNTER — Encounter: Payer: Self-pay | Admitting: Internal Medicine

## 2020-09-15 DIAGNOSIS — Z3490 Encounter for supervision of normal pregnancy, unspecified, unspecified trimester: Secondary | ICD-10-CM | POA: Insufficient documentation

## 2020-09-15 DIAGNOSIS — U071 COVID-19: Secondary | ICD-10-CM | POA: Insufficient documentation

## 2020-09-15 NOTE — Telephone Encounter (Signed)
Left detailed message for patient.

## 2020-09-15 NOTE — Assessment & Plan Note (Addendum)
Diagnosed with covid - PCR positive - 09/06/20.  Symptoms started 09/05/20.  Sore throat, cough, congestion has improved.  Still with fatigue.  Not on any medication now.  Using neti pot.  Appetite is better.  Given persistent increased fatigue and given [redacted] weeks pregnant, will have her remain out of work for a few more days. Discussed plan to return to work 09/17/20.  Note for work provided. Follow symptoms.  Call with update.

## 2020-09-15 NOTE — Assessment & Plan Note (Signed)
Pregnancy going well.  OB aware of covid.  Has f/u planned end of week.

## 2020-09-15 NOTE — Telephone Encounter (Signed)
Return to work letter typed.  Please confirm pt received.  Thanks.

## 2020-09-18 ENCOUNTER — Ambulatory Visit (INDEPENDENT_AMBULATORY_CARE_PROVIDER_SITE_OTHER): Payer: BC Managed Care – PPO | Admitting: Certified Nurse Midwife

## 2020-09-18 ENCOUNTER — Other Ambulatory Visit: Payer: Self-pay

## 2020-09-18 ENCOUNTER — Telehealth: Payer: Self-pay

## 2020-09-18 DIAGNOSIS — O98513 Other viral diseases complicating pregnancy, third trimester: Secondary | ICD-10-CM

## 2020-09-18 DIAGNOSIS — Z3403 Encounter for supervision of normal first pregnancy, third trimester: Secondary | ICD-10-CM

## 2020-09-18 DIAGNOSIS — Z0289 Encounter for other administrative examinations: Secondary | ICD-10-CM

## 2020-09-18 DIAGNOSIS — Z3A37 37 weeks gestation of pregnancy: Secondary | ICD-10-CM

## 2020-09-18 DIAGNOSIS — U071 COVID-19: Secondary | ICD-10-CM

## 2020-09-18 NOTE — Telephone Encounter (Signed)
Covid quarantine guidelines - quarantine 10 days from symptom start and need to be fever free (on medication to keep temperature down) for 24 hours and symptoms improving.  I do recommend wearing a mask.

## 2020-09-18 NOTE — Progress Notes (Signed)
Virtual Visit via Telephone Note  I connected with Linda Mendez on 09/18/20 at  3:45 PM EST by telephone and verified that I am speaking with the correct person using two identifiers.  Location:  Patient: Linda Mendez (home)  Provider: [name], CNM ([facility], [facility])   I discussed the limitations, risks, security and privacy concerns of performing an evaluation and management service by telephone and the availability of in person appointments. I also discussed with the patient that there may be a patient responsible charge related to this service. The patient expressed understanding and agreed to proceed.   History of Present Illness:  Patient called for routine prenatal care visit at [redacted] weeks gestation after being diagnosed with COVID-19 on 09/08/2020.    Observations/Objective:  Feeling approximately 95%, symptoms consisted of upper respiratory congestion and fatigue.   Reports good fetal movement. Denies difficulty breathing or respiratory distress, chest pain, abdominal pain or contractions, vaginal bleeding or leakage of fluid, and leg pain or swelling.    Assessment:  Supervision of first normal pregnancy  [redacted] weeks pregnant  COVID-19  Plan:  Encouraged twice daily kick counts.   Anticipatory guidance regarding GBS testing and course of prenatal care.   Reviewed red flag symptoms and when to call.   RTC x 1 week for in-person ROB with ANNIE or sooner if needed.    Follow Up Instructions: See AVS    I discussed the assessment and treatment plan with the patient. The patient was provided an opportunity to ask questions and all were answered. The patient agreed with the plan and demonstrated an understanding of the instructions.   The patient was advised to call back or seek an in-person evaluation if the symptoms worsen or if the condition fails to improve as anticipated.  I provided 8 minutes of non-face-to-face time during this  encounter.   [name], CNM [facility], [facility] 09/18/20 5:36 PM

## 2020-09-18 NOTE — Progress Notes (Signed)
Received a transferred call from [place] for a televisit. DOB as identifier. Patient states she has good fetal movement. Denies vaginal bleeding or cramping. Patient does not have access to a BP cuff or a scale. Call transferred to [name] CNM for completion of visit.

## 2020-09-18 NOTE — Patient Instructions (Signed)
Fetal Movement Counts Patient Name: ________________________________________________ Patient Due Date: ____________________  What is a fetal movement count? A fetal movement count is the number of times that you feel your baby move during a certain amount of time. This may also be called a fetal kick count. A fetal movement count is recommended for every pregnant woman. You may be asked to start counting fetal movements as early as week 28 of your pregnancy. Pay attention to when your baby is most active. You may notice your baby's sleep and wake cycles. You may also notice things that make your baby move more. You should do a fetal movement count:  When your baby is normally most active.  At the same time each day. A good time to count movements is while you are resting, after having something to eat and drink. How do I count fetal movements? 1. Find a quiet, comfortable area. Sit, or lie down on your side. 2. Write down the date, the start time and stop time, and the number of movements that you felt between those two times. Take this information with you to your health care visits. 3. Write down your start time when you feel the first movement. 4. Count kicks, flutters, swishes, rolls, and jabs. You should feel at least 10 movements. 5. You may stop counting after you have felt 10 movements, or if you have been counting for 2 hours. Write down the stop time. 6. If you do not feel 10 movements in 2 hours, contact your health care provider for further instructions. Your health care provider may want to do additional tests to assess your baby's well-being. Contact a health care provider if:  You feel fewer than 10 movements in 2 hours.  Your baby is not moving like he or she usually does. Date: ____________ Start time: ____________ Stop time: ____________ Movements: ____________ Date: ____________ Start time: ____________ Stop time: ____________ Movements: ____________ Date: ____________  Start time: ____________ Stop time: ____________ Movements: ____________ Date: ____________ Start time: ____________ Stop time: ____________ Movements: ____________ Date: ____________ Start time: ____________ Stop time: ____________ Movements: ____________ Date: ____________ Start time: ____________ Stop time: ____________ Movements: ____________ Date: ____________ Start time: ____________ Stop time: ____________ Movements: ____________ Date: ____________ Start time: ____________ Stop time: ____________ Movements: ____________ Date: ____________ Start time: ____________ Stop time: ____________ Movements: ____________ This information is not intended to replace advice given to you by your health care provider. Make sure you discuss any questions you have with your health care provider. Document Revised: 03/21/2019 Document Reviewed: 03/21/2019 Elsevier Patient Education  2021 Elsevier Inc.   Famotidine chewable tablet What is this medicine? FAMOTIDINE (fa MOE ti deen) is a type of antihistamine that blocks the release of stomach acid. It is used to treat stomach or intestinal ulcers. It can also relieve heartburn from acid reflux. This medicine may be used for other purposes; ask your health care provider or pharmacist if you have questions. COMMON BRAND NAME(S): Pepcid AC What should I tell my health care provider before I take this medicine? They need to know if you have any of these conditions:  kidney or liver disease  phenylketonuria  trouble swallowing  an unusual or allergic reaction to famotidine, other medicines, foods, dyes, or preservatives  pregnant or trying to get pregnant  breast-feeding How should I use this medicine? Take this medicine by mouth. Chew it completely before swallowing. Follow the directions on the prescription label. Take your doses at regular intervals. Do not take your medicine  more often than directed. Talk to your pediatrician regarding the use of  this medicine in children. While this medicine may be prescribed for children for selected conditions, precautions do apply. Overdosage: If you think you have taken too much of this medicine contact a poison control center or emergency room at once. NOTE: This medicine is only for you. Do not share this medicine with others. What if I miss a dose? If you miss a dose, take it as soon as you can. If it is almost time for your next dose, take only that dose. Do not take double or extra doses. What may interact with this medicine?  delavirdine  itraconazole  ketoconazole This list may not describe all possible interactions. Give your health care provider a list of all the medicines, herbs, non-prescription drugs, or dietary supplements you use. Also tell them if you smoke, drink alcohol, or use illegal drugs. Some items may interact with your medicine. What should I watch for while using this medicine? Tell your doctor or health care professional if your condition does not start to get better or if it gets worse. Finish the full course of tablets prescribed, even if you feel better. Do not take with aspirin, ibuprofen or other antiinflammatory medicines. These can make your condition worse. Do not smoke cigarettes or drink alcohol. These cause irritation in your stomach and can increase the time it will take for ulcers to heal. If you get black, tarry stools or vomit up what looks like coffee grounds, call your doctor or health care professional at once. You may have a bleeding ulcer. If you have phenylketonuria you should not use this medicine as it contains phenylalanine. This medicine may cause a decrease in vitamin B12. You should make sure that you get enough vitamin B12 while you are taking this medicine. Discuss the foods you eat and the vitamins you take with your health care professional. What side effects may I notice from receiving this medicine? Side effects that you should report to  your doctor or health care professional as soon as possible:  agitation, nervousness  confusion  hallucinations  skin rash, itching Side effects that usually do not require medical attention (report to your doctor or health care professional if they continue or are bothersome):  constipation  diarrhea  dizziness  headache This list may not describe all possible side effects. Call your doctor for medical advice about side effects. You may report side effects to FDA at 1-800-FDA-1088. Where should I keep my medicine? Keep out of the reach of children. Store at room temperature between 20 and 30 degrees C (68 and 86 degrees F). Protect from moisture. Throw away any unused medicine after the expiration date. NOTE: This sheet is a summary. It may not cover all possible information. If you have questions about this medicine, talk to your doctor, pharmacist, or health care provider.  2021 Elsevier/Gold Standard (2017-03-17 13:16:04)   Heartburn During Pregnancy  Heartburn is a type of pain or discomfort in the throat or chest. It may cause a burning feeling. It happens when stomach acid backs up into the part of the body that moves food from your mouth to your stomach (esophagus). This condition is also called acid reflux. Heartburn is common during pregnancy. It usually goes away or gets better after giving birth. What are the causes? This condition is caused by stomach acid that backs up into the part of the body that moves food from your mouth to your stomach.  Acid can back up because of:  Changing amounts of hormones in the body.  Large meals.  Certain foods and drinks.  Exercise.  More acid being made in the stomach. What increases the risk? You are more likely to develop this condition if:  You had heartburn before you became pregnant.  You have been pregnant more than once before.  You are overweight or obese. Heartburn is also likely to happen as you get further  along in your pregnancy. The risk is higher in the last 3 months before birth (third trimester). What are the signs or symptoms? Symptoms of this condition include:  Burning pain in the chest or lower throat.  A bitter taste in the mouth.  Coughing.  Problems swallowing.  Vomiting.  A hoarse voice.  Asthma. Symptoms may get worse when you lie down or bend over. You may feel worse at night. How is this treated? Treatment for this condition depends on how bad your symptoms are. Your doctor may ask you to:  Take over-the-counter medicines for mild heartburn. These medicines include antacids or acid reducers.  Take prescription medicines to reduce stomach acid or to protect your stomach.  Change your diet.  Raise the head of your bed so it is higher than the foot of the bed. Follow these instructions at home: Eating and drinking  Do not drink alcohol while you are pregnant.  Learn which foods and drinks make you feel worse, and avoid them.  Eat small meals often, instead of large meals.  Avoid drinking a lot of liquid with your meals.  Avoid eating meals during the 2-3 hours before you go to bed.  Avoid lying down right after you eat.  Do not exercise right after you eat. Drinks to avoid  Coffee and tea (with or without caffeine).  Energy drinks and sports drinks.  Carbonated drinks or sodas.  Citrus fruit juices. Foods to avoid  Chocolate and cocoa.  Peppermint and mint flavorings.  Garlic, onions, and horseradish.  Spicy foods and foods that have a lot of acid in them. These include peppers, chili powder, curry powder, vinegar, hot sauces, and barbecue sauce.  Citrus fruits, such as oranges, lemons, and limes.  Tomato-based foods, such as red sauce, chili, and salsa.  Fried and fatty foods, such as donuts, french fries, potato chips, and high-fat dressings.  High-fat meats, such as hot dogs, precooked or cured meats, sausage, ham, and  bacon.  High-fat dairy items, such as whole milk, butter, and cheese. Medicines  Take over-the-counter and prescription medicines only as told by your doctor.  Do not take aspirin or NSAIDs, such as ibuprofen, unless your doctor tells you to do that.  Your doctor may tell you to avoid medicines that have sodium bicarbonate in them. General instructions  If told, raise the head of your bed about 6 inches (15 cm). You can do this by putting blocks under the legs. Sleeping with more pillows does not help with heartburn.  Do not use any products that contain nicotine or tobacco, such as cigarettes, e-cigarettes, and chewing tobacco. If you need help quitting, ask your doctor.  Wear loose-fitting clothing.  Try to lower your stress, such as with yoga or meditation. If you need help, ask your doctor.  Stay at a healthy weight. If you are overweight, work with your doctor to safely manage your weight.  Keep all follow-up visits as told by your doctor. This is important. Where to find more information  American Pregnancy  Association: americanpregnancy.org Contact a doctor if:  You get new symptoms.  Your symptoms do not get better with treatment.  You lose weight and you do not know why.  You have trouble swallowing.  You make loud sounds when you breathe (wheeze).  You have a cough that does not go away.  You have heartburn often for more than 2 weeks.  You feel like you may vomit (nausea), or you vomit, and this does not get better with treatment.  You have pain in your belly (abdomen). Get help right away if:  You have very bad chest pain that spreads to your arm, neck, or jaw.  You feel sweaty, dizzy, or light-headed.  You have trouble breathing.  You have pain when swallowing.  You vomit, and your vomit looks like blood or coffee grounds.  Your poop (stool) is bloody or black. Summary  Heartburn in pregnancy is common, especially during the last 3 months  before birth.  This condition is caused by stomach acid backing up into the part of the body that moves food from the mouth to the stomach.  This condition can be treated with medicines, changes to your diet, or raising the head of your bed.  Contact a doctor if your symptoms do not go away or you get new symptoms. This information is not intended to replace advice given to you by your health care provider. Make sure you discuss any questions you have with your health care provider. Document Revised: 04/24/2019 Document Reviewed: 04/24/2019 Elsevier Patient Education  2021 [employer].

## 2020-09-18 NOTE — Telephone Encounter (Signed)
Per access nurse: Caller wanted to know is she could go to a baby shower if she had tested positive 2 weeks ago.       [facility] Night - Cl TELEPHONE ADVICE RECORD Access[facility]urse Patient Name: Linda Mendez Gender: Female DOB: [birth year] Age: 25 Y 5 D Return Phone Number: [phone] (Primary) Address: City/State/Zip: [name] [address] Client [facility] Night - Cl Client Site [facility] - Night Physician [place] - MD Contact Type Call Who Is Calling Patient / Member / Family / Caregiver Call Type Triage / Clinical Relationship To Patient Self Return Phone Number [phone] (Primary) Chief Complaint Fatigue (>THREE MONTHS) Reason for Call Symptomatic / Request for Health Information Initial Comment Caller tested pos for covid and wants to know if she is infectious as she has baby shower for herself coming up. Caller is fatigued, but no other symptoms at this time. Spouse also has covid. Translation No Nurse Assessment Nurse: Kathyrn Sheriff, RN, Judeth Cornfield Date/Time (Eastern Time): 09/17/2020 6:42:57 PM Confirm and document reason for call. If symptomatic, describe symptoms. ---Caller stated that she tested positive for covid 2 weeks ago but her husband tested positive today. Caller stated that she is [redacted] weeks pregnant. Caller denies ctx, vag bleeding, or LOF. Caller denies any symptoms. Does the patient have any new or worsening symptoms? ---Yes Will a triage be completed? ---Yes Related visit to physician within the last 2 weeks? ---Yes Does the PT have any chronic conditions? (i.e. diabetes, asthma, this includes High risk factors for pregnancy, etc.) ---No Is the patient pregnant or possibly pregnant? (Ask all females between the ages of 56-55) ---Yes How many weeks gestation? ---78.6 What is the estimated delivery date? ---2020-10-09 Total number of pregnancies including current?  ---1 Number of live births? ---0 Have you felt decreased fetal movement? ---No Is this a behavioral health or substance abuse call? ---No PLEASE NOTE: All timestamps contained within this report are represented as [place] Standard Time. CONFIDENTIALTY NOTICE: This fax transmission is intended only for the addressee. It contains information that is legally privileged, confidential or otherwise protected from use or disclosure. If you are not the intended recipient, you are strictly prohibited from reviewing, disclosing, copying using or disseminating any of this information or taking any action in reliance on or regarding this information. If you have received this fax in error, please notify us immediately by telephone so that we can arrange for its return to [place]. Phone: [phone], Toll-Free: [phone], Fax: [phone] Page: 2 of 2 Call Id: [id number] Guidelines Guideline Title Affirmed Question Affirmed Notes Nurse Date/Time Lamount Cohen Time) COVID-19 - Diagnosed or Suspected COVID-19 Home Isolation, questions about Kathyrn Sheriff, RNJudeth Cornfield 09/17/2020 6:46:34 PM Disp. Time Lamount Cohen Time) Disposition Final User 09/17/2020 6:51:45 PM Home Care Yes [place], RN, Loura Halt Disagree/Comply Comply Caller Understands Yes PreDisposition Did not know what to do Care Advice Given Per Guideline HOME CARE: * You should be able to treat this at home. COVID-19 - HOW TO PROTECT OTHERS - WHEN YOU ARE SICK WITH COVID-19: * STAY HOME A MINIMUM OF 5 DAYS: Home isolation is needed for at least 5 days after the symptoms started. Stay home from school or work if you are sick. Do NOT go to religious services, child care centers, shopping, or other public places. Do NOT use public transportation (e.g., bus, taxis, ride-sharing). Do NOT allow any visitors to your home. Leave the house only if you need to seek urgent medical care. *  WEAR A MASK FOR 10 DAYS: Wear a well-fitted mask for 10 full days any  time you are around others inside your home or in public. Do not go to places where you are unable to wear a mask. FAQ - WHEN CAN I STOP HOME ISOLATION IF I AM SICK WITH COVID-19? * You can STOP HOME ISOLATION AFTER 5 DAYS if: * ... Any cough and other symptoms are improving * Wear a well-fitted MASK FOR 10 FULL DAYS any time you are around others inside your home or in public. Do not go to places where you are unable to wear a mask. CALL BACK IF: * You have more questions CARE ADVICE given per COVID-19 - DIAGNOSED OR SUSPECTED (Adult) guideline. Comments User: Hannah Beat, RN Date/Time Lamount Cohen Time): 09/17/2020 6:51:40 PM Caller wanted to know is she could go to a baby shower if she had tested positive 2 weeks ago.

## 2020-09-18 NOTE — Telephone Encounter (Signed)
Pt is aware.  

## 2020-09-29 ENCOUNTER — Other Ambulatory Visit: Payer: Self-pay | Admitting: Certified Nurse Midwife

## 2020-09-29 ENCOUNTER — Encounter: Payer: Self-pay | Admitting: Certified Nurse Midwife

## 2020-09-29 ENCOUNTER — Other Ambulatory Visit: Payer: Self-pay

## 2020-09-29 ENCOUNTER — Ambulatory Visit (INDEPENDENT_AMBULATORY_CARE_PROVIDER_SITE_OTHER): Payer: BC Managed Care – PPO | Admitting: Certified Nurse Midwife

## 2020-09-29 VITALS — BP 127/77 | HR 91 | Wt 153.7 lb

## 2020-09-29 DIAGNOSIS — Z3A38 38 weeks gestation of pregnancy: Secondary | ICD-10-CM

## 2020-09-29 LAB — POCT URINALYSIS DIPSTICK OB
Appearance: NORMAL
Bilirubin, UA: NEGATIVE
Blood, UA: NEGATIVE
Glucose, UA: NEGATIVE
Ketones, UA: NEGATIVE
Leukocytes, UA: NEGATIVE
Nitrite, UA: NEGATIVE
Odor: NORMAL
POC,PROTEIN,UA: NEGATIVE
Spec Grav, UA: 1.01 (ref 1.010–1.025)
Urobilinogen, UA: 0.2 E.U./dL
pH, UA: 6 (ref 5.0–8.0)

## 2020-09-29 NOTE — Patient Instructions (Signed)

## 2020-09-29 NOTE — Progress Notes (Signed)
ROB- She has been having some intermittent lower back pain.

## 2020-09-29 NOTE — Progress Notes (Signed)
ROB doing well. Feels good fetal movement. Discussed water birth , class completion certificate collected. Consent signed. She has gotten the supplies needed. ACOG/CNM position statement given. GBS and cultures collected today. Declines SVE, Leopold's suggest vertex position . Labor precautions reviewed. Follow up 1 wk with Marcelino Duster.   Doreene Burke, CNM

## 2020-10-01 LAB — GC/CHLAMYDIA PROBE AMP
Chlamydia trachomatis, NAA: NEGATIVE
Neisseria Gonorrhoeae by PCR: NEGATIVE

## 2020-10-01 LAB — STREP GP B NAA+RFLX: Strep Gp B NAA+Rflx: NEGATIVE

## 2020-10-05 ENCOUNTER — Other Ambulatory Visit: Payer: Self-pay

## 2020-10-05 ENCOUNTER — Ambulatory Visit (INDEPENDENT_AMBULATORY_CARE_PROVIDER_SITE_OTHER): Payer: BC Managed Care – PPO | Admitting: Certified Nurse Midwife

## 2020-10-05 ENCOUNTER — Encounter: Payer: Self-pay | Admitting: Certified Nurse Midwife

## 2020-10-05 ENCOUNTER — Encounter: Payer: BC Managed Care – PPO | Admitting: Certified Nurse Midwife

## 2020-10-05 VITALS — BP 103/62 | HR 89 | Wt 152.7 lb

## 2020-10-05 DIAGNOSIS — O480 Post-term pregnancy: Secondary | ICD-10-CM

## 2020-10-05 DIAGNOSIS — Z3A39 39 weeks gestation of pregnancy: Secondary | ICD-10-CM

## 2020-10-05 DIAGNOSIS — Z3403 Encounter for supervision of normal first pregnancy, third trimester: Secondary | ICD-10-CM

## 2020-10-05 LAB — POCT URINALYSIS DIPSTICK OB
Bilirubin, UA: NEGATIVE
Blood, UA: NEGATIVE
Glucose, UA: NEGATIVE
Ketones, UA: NEGATIVE
Leukocytes, UA: NEGATIVE
Nitrite, UA: NEGATIVE
Odor: NEGATIVE
POC,PROTEIN,UA: NEGATIVE
Spec Grav, UA: 1.02 (ref 1.010–1.025)
Urobilinogen, UA: 0.2 E.U./dL
pH, UA: 6 (ref 5.0–8.0)

## 2020-10-05 NOTE — Progress Notes (Signed)
Rob- no complaints. Unsure if she wants a cervix check today.

## 2020-10-05 NOTE — Progress Notes (Signed)
ROB-Doing well, started Parental Leave today. Using home labor preparation techniques. SVE declined. Anticipatory guidance regarding postdates care. Reviewed red flag symptoms and when to call. RTC x 1 week for growth ultrasound/BPP and ROB or sooner if needed.

## 2020-10-05 NOTE — Patient Instructions (Signed)
First Stage of Labor Labor is your body's natural process of moving your baby and other structures, including the placenta and umbilical cord, out of your uterus. There are three stages of labor. How long each stage lasts is different for every woman. But certain events happen during each stage that are the same for everyone.  The first stage starts when true labor begins. This stage ends when your cervix, which is the opening from your uterus into your vagina, is completely open (dilated).  The second stage begins when your cervix is fully dilated and you start pushing. This stage ends when your baby is born.  The third stage is the delivery of the organ that nourished your baby during pregnancy (placenta). First stage of labor As your due date gets closer, you may start to notice certain physical changes that mean labor is going to start soon. You may feel that your baby has dropped lower into your pelvis. You may experience irregular, often painless, contractions that go away when you walk around or lie down (Braxton Hicks contractions). This is also called false labor. The first stage of labor begins when you start having contractions that come at regular (evenly spaced) intervals and your cervix starts to get thinner and wider in preparation for your baby to pass through. Birth care providers measure the dilation of your cervix in centimeters (cm). One centimeter is a little less than one-half of an inch. The first stage ends when your cervix is dilated to 10 cm. The first stage of labor is divided into three phases:  Early phase.  Active phase.  Transitional phase. The length of the first stage of labor varies. It may be longer if this is your first pregnancy. You may spend most of this stage at home trying to relax and stay comfortable. How does this affect me? During the first stage of labor, you will move through three phases. What happens in the early phase?  You will start to have  regular contractions that last 30-60 seconds. Contractions may come every 5-20 minutes. Keep track of your contractions and call your birth care provider.  Your water may break during this phase.  You may notice a clear or slightly bloody discharge of mucus (mucus plug) from your vagina.  Your cervix will dilate to 3-6 cm. What happens in the active phase? The active phase usually lasts 3-5 hours. You may go to the hospital or birth center around this time. During the active phase:  Your contractions will become stronger, longer, and more uncomfortable.  Your contractions may last 45-90 seconds and come every 3-5 minutes.  You may feel lower back pain.  Your birth care providers may examine your cervix and feel your belly to find the position of your baby.  You may have a monitor strapped to your belly to measure your contractions and your baby's heart rate.  You may start using your pain management options.  Your cervix may be dilated to 6 cm and may start to dilate more quickly. What happens in the transitional phase? The transitional phase typically lasts from 30 minutes to 2 hours. At the end of this phase, your cervix will be fully dilated to 10 cm. During the transitional phase:  Contractions will get stronger and longer.  Contractions may last 60-90 seconds and come less than 2 minutes apart.  You may feel hot flashes, chills, or nausea. How does this affect my baby? During the first stage of labor, your baby will   gradually move down into your birth canal. Follow these instructions at home and in the hospital or birth center:  When labor first begins, try to stay calm. You are still in the early phase. If it is night, try to get some sleep. If it is day, try to relax and save your energy. You may want to make some calls and get ready to go to the hospital or birth center.  When you are in the early phase, try these methods to help ease discomfort: ? Deep breathing and  muscle relaxation. ? Taking a walk. ? Taking a warm bath or shower.  Drink some fluids and have a light snack if you feel like it.  Keep track of your contractions.  Based on the plan you created with your birth care provider, call when your contractions indicate it is time.  If your water breaks, note the time, color, and odor of the fluid.  When you are in the active phase, do your breathing exercises and rely on your support people and your team of birth care providers.   Contact a health care provider if:  Your contractions are strong and regular.  You have lower back pain or cramping.  Your water breaks.  You lose your mucus plug. Get help right away if you:  Have a severe headache that does not go away.  Have changes in your vision.  Have severe pain in your upper belly.  Do not feel the baby move.  Have bright red bleeding. Summary  The first stage of labor starts when true labor begins, and it ends when your cervix is dilated to 10 cm.  The first stage of labor has three phases: early, active, and transitional.  Your baby moves into the birth canal during the first stage of labor.  You may have contractions that become stronger and longer. You may also lose your mucus plug and have your water break.  Call your birth care provider when your contractions are frequent and strong enough to go to the hospital or birth center. This information is not intended to replace advice given to you by your health care provider. Make sure you discuss any questions you have with your health care provider. Document Revised: 11/22/2018 Document Reviewed: 10/15/2017 Elsevier Patient Education  2021 [employer]   Fetal Movement Counts Patient Name: ________________________________________________ Patient Due Date: ____________________  What is a fetal movement count? A fetal movement count is the number of times that you feel your baby move during a certain amount of time.  This may also be called a fetal kick count. A fetal movement count is recommended for every pregnant woman. You may be asked to start counting fetal movements as early as week 28 of your pregnancy. Pay attention to when your baby is most active. You may notice your baby's sleep and wake cycles. You may also notice things that make your baby move more. You should do a fetal movement count:  When your baby is normally most active.  At the same time each day. A good time to count movements is while you are resting, after having something to eat and drink. How do I count fetal movements? 1. Find a quiet, comfortable area. Sit, or lie down on your side. 2. Write down the date, the start time and stop time, and the number of movements that you felt between those two times. Take this information with you to your health care visits. 3. Write down your start time   when you feel the first movement. 4. Count kicks, flutters, swishes, rolls, and jabs. You should feel at least 10 movements. 5. You may stop counting after you have felt 10 movements, or if you have been counting for 2 hours. Write down the stop time. 6. If you do not feel 10 movements in 2 hours, contact your health care provider for further instructions. Your health care provider may want to do additional tests to assess your baby's well-being. Contact a health care provider if:  You feel fewer than 10 movements in 2 hours.  Your baby is not moving like he or she usually does. Date: ____________ Start time: ____________ Stop time: ____________ Movements: ____________ Date: ____________ Start time: ____________ Stop time: ____________ Movements: ____________ Date: ____________ Start time: ____________ Stop time: ____________ Movements: ____________ Date: ____________ Start time: ____________ Stop time: ____________ Movements: ____________ Date: ____________ Start time: ____________ Stop time: ____________ Movements: ____________ Date:  ____________ Start time: ____________ Stop time: ____________ Movements: ____________ Date: ____________ Start time: ____________ Stop time: ____________ Movements: ____________ Date: ____________ Start time: ____________ Stop time: ____________ Movements: ____________ Date: ____________ Start time: ____________ Stop time: ____________ Movements: ____________ This information is not intended to replace advice given to you by your health care provider. Make sure you discuss any questions you have with your health care provider. Document Revised: 03/21/2019 Document Reviewed: 03/21/2019 Elsevier Patient Education  2021 Elsevier Inc.  

## 2020-10-08 ENCOUNTER — Ambulatory Visit: Payer: BC Managed Care – PPO | Admitting: Internal Medicine

## 2020-10-09 ENCOUNTER — Telehealth: Payer: Self-pay

## 2020-10-09 ENCOUNTER — Inpatient Hospital Stay: Admit: 2020-10-09 | Payer: Self-pay

## 2020-10-09 NOTE — Telephone Encounter (Signed)
Called pt and le her know we had to move her ultrasound  To the hospital and I informed her where to go and what time to be there with a full bladder.

## 2020-10-13 ENCOUNTER — Other Ambulatory Visit: Payer: Self-pay | Admitting: Certified Nurse Midwife

## 2020-10-13 DIAGNOSIS — O480 Post-term pregnancy: Secondary | ICD-10-CM

## 2020-10-13 DIAGNOSIS — Z3403 Encounter for supervision of normal first pregnancy, third trimester: Secondary | ICD-10-CM

## 2020-10-14 ENCOUNTER — Other Ambulatory Visit: Payer: BC Managed Care – PPO

## 2020-10-14 ENCOUNTER — Encounter: Payer: Self-pay | Admitting: Certified Nurse Midwife

## 2020-10-14 ENCOUNTER — Ambulatory Visit (INDEPENDENT_AMBULATORY_CARE_PROVIDER_SITE_OTHER): Payer: BC Managed Care – PPO | Admitting: Certified Nurse Midwife

## 2020-10-14 ENCOUNTER — Telehealth: Payer: Self-pay

## 2020-10-14 ENCOUNTER — Other Ambulatory Visit: Payer: Self-pay

## 2020-10-14 ENCOUNTER — Telehealth: Payer: Self-pay | Admitting: Certified Nurse Midwife

## 2020-10-14 VITALS — BP 110/72 | HR 97 | Wt 153.4 lb

## 2020-10-14 DIAGNOSIS — Z3403 Encounter for supervision of normal first pregnancy, third trimester: Secondary | ICD-10-CM

## 2020-10-14 DIAGNOSIS — Z3A40 40 weeks gestation of pregnancy: Secondary | ICD-10-CM

## 2020-10-14 LAB — POCT URINALYSIS DIPSTICK OB
Bilirubin, UA: NEGATIVE
Glucose, UA: NEGATIVE
Ketones, UA: NEGATIVE
Leukocytes, UA: NEGATIVE
Nitrite, UA: NEGATIVE
Odor: NEGATIVE
POC,PROTEIN,UA: NEGATIVE
Spec Grav, UA: <=1.005 — AB (ref 1.010–1.025)
Urobilinogen, UA: 0.2 E.U./dL
pH, UA: 6 (ref 5.0–8.0)

## 2020-10-14 NOTE — Patient Instructions (Signed)

## 2020-10-14 NOTE — Telephone Encounter (Signed)
Call transferred from front desk-   OB 40 5/7  Sporadic contractions x 1 hour- 3-5 minutes apart.  NO VB  +FM  NO LOF.   Pt advised to monitor for now. Keep appt for today at 2pm.

## 2020-10-14 NOTE — Progress Notes (Signed)
ROB doing well. Feels good movement , is having some ctx q 4-5 min that are mild to moderate. She denies leaking of fluid or any bleeding. Discussed birth plan. She declines induction at this time, would like labor to start naturally. Discussed risks of going past 41 wks. She verbalizes understanding. She will folllow up on Friday for NST, her u/s has been scheduled on 3/9 . Discussed antenatal testing due to post dates and she is in agreement. SVE 1/65/-2. Follow for labor or on Friday with [facility].   Doreene Burke, CNM

## 2020-10-14 NOTE — Telephone Encounter (Signed)
error 

## 2020-10-14 NOTE — Progress Notes (Signed)
ROB- contractions x 5 hours- 3-5 minutes 45sec long- q 4.5 minutes.

## 2020-10-15 ENCOUNTER — Encounter: Payer: BC Managed Care – PPO | Admitting: Certified Nurse Midwife

## 2020-10-15 ENCOUNTER — Other Ambulatory Visit: Payer: BC Managed Care – PPO

## 2020-10-16 ENCOUNTER — Ambulatory Visit (INDEPENDENT_AMBULATORY_CARE_PROVIDER_SITE_OTHER): Payer: BC Managed Care – PPO | Admitting: Certified Nurse Midwife

## 2020-10-16 ENCOUNTER — Encounter: Payer: Self-pay | Admitting: Certified Nurse Midwife

## 2020-10-16 ENCOUNTER — Other Ambulatory Visit: Payer: Self-pay

## 2020-10-16 ENCOUNTER — Other Ambulatory Visit: Payer: BC Managed Care – PPO

## 2020-10-16 VITALS — BP 119/81 | HR 103 | Wt 147.6 lb

## 2020-10-16 DIAGNOSIS — Z3403 Encounter for supervision of normal first pregnancy, third trimester: Secondary | ICD-10-CM

## 2020-10-16 DIAGNOSIS — Z3689 Encounter for other specified antenatal screening: Secondary | ICD-10-CM | POA: Diagnosis not present

## 2020-10-16 DIAGNOSIS — Z3A41 41 weeks gestation of pregnancy: Secondary | ICD-10-CM | POA: Diagnosis not present

## 2020-10-16 LAB — FETAL NONSTRESS TEST

## 2020-10-16 LAB — POCT URINALYSIS DIPSTICK OB
Bilirubin, UA: 0.2
Blood, UA: NEGATIVE
Glucose, UA: NEGATIVE
Ketones, UA: NEGATIVE
Leukocytes, UA: NEGATIVE
Nitrite, UA: NEGATIVE
POC,PROTEIN,UA: NEGATIVE
Spec Grav, UA: 1.015 (ref 1.010–1.025)
Urobilinogen, UA: NEGATIVE E.U./dL — AB
pH, UA: 6.5 (ref 5.0–8.0)

## 2020-10-16 NOTE — Progress Notes (Signed)
ROB, pt here for NST/routine prenatal care.  Pt c/o lower pelvic pressure, contractions and bloody show started on Wednesday.

## 2020-10-16 NOTE — Progress Notes (Signed)
ROB-Reports irregular contractions, pelvic pain and insomnia. Loss mucus plug after office visit on Wednesday. NST reactive, see chart. Discussed home labor preparation and sleep techniques. Agrees to IOL on Tuesday, 10/20/2020; scheduled for midnight, see orders. Reviewed red flag symptoms and when to call. Reports to [employer] at [facility] as scheduled or sooner if needed.

## 2020-10-16 NOTE — Patient Instructions (Signed)
First Stage of Labor Labor is your body's natural process of moving your baby and other structures, including the placenta and umbilical cord, out of your uterus. There are three stages of labor. How long each stage lasts is different for every woman. But certain events happen during each stage that are the same for everyone.  The first stage starts when true labor begins. This stage ends when your cervix, which is the opening from your uterus into your vagina, is completely open (dilated).  The second stage begins when your cervix is fully dilated and you start pushing. This stage ends when your baby is born.  The third stage is the delivery of the organ that nourished your baby during pregnancy (placenta). First stage of labor As your due date gets closer, you may start to notice certain physical changes that mean labor is going to start soon. You may feel that your baby has dropped lower into your pelvis. You may experience irregular, often painless, contractions that go away when you walk around or lie down (Braxton Hicks contractions). This is also called false labor. The first stage of labor begins when you start having contractions that come at regular (evenly spaced) intervals and your cervix starts to get thinner and wider in preparation for your baby to pass through. Birth care providers measure the dilation of your cervix in centimeters (cm). One centimeter is a little less than one-half of an inch. The first stage ends when your cervix is dilated to 10 cm. The first stage of labor is divided into three phases:  Early phase.  Active phase.  Transitional phase. The length of the first stage of labor varies. It may be longer if this is your first pregnancy. You may spend most of this stage at home trying to relax and stay comfortable. How does this affect me? During the first stage of labor, you will move through three phases. What happens in the early phase?  You will start to have  regular contractions that last 30-60 seconds. Contractions may come every 5-20 minutes. Keep track of your contractions and call your birth care provider.  Your water may break during this phase.  You may notice a clear or slightly bloody discharge of mucus (mucus plug) from your vagina.  Your cervix will dilate to 3-6 cm. What happens in the active phase? The active phase usually lasts 3-5 hours. You may go to the hospital or birth center around this time. During the active phase:  Your contractions will become stronger, longer, and more uncomfortable.  Your contractions may last 45-90 seconds and come every 3-5 minutes.  You may feel lower back pain.  Your birth care providers may examine your cervix and feel your belly to find the position of your baby.  You may have a monitor strapped to your belly to measure your contractions and your baby's heart rate.  You may start using your pain management options.  Your cervix may be dilated to 6 cm and may start to dilate more quickly. What happens in the transitional phase? The transitional phase typically lasts from 30 minutes to 2 hours. At the end of this phase, your cervix will be fully dilated to 10 cm. During the transitional phase:  Contractions will get stronger and longer.  Contractions may last 60-90 seconds and come less than 2 minutes apart.  You may feel hot flashes, chills, or nausea. How does this affect my baby? During the first stage of labor, your baby will   gradually move down into your birth canal. Follow these instructions at home and in the hospital or birth center:  When labor first begins, try to stay calm. You are still in the early phase. If it is night, try to get some sleep. If it is day, try to relax and save your energy. You may want to make some calls and get ready to go to the hospital or birth center.  When you are in the early phase, try these methods to help ease discomfort: ? Deep breathing and  muscle relaxation. ? Taking a walk. ? Taking a warm bath or shower.  Drink some fluids and have a light snack if you feel like it.  Keep track of your contractions.  Based on the plan you created with your birth care provider, call when your contractions indicate it is time.  If your water breaks, note the time, color, and odor of the fluid.  When you are in the active phase, do your breathing exercises and rely on your support people and your team of birth care providers.   Contact a health care provider if:  Your contractions are strong and regular.  You have lower back pain or cramping.  Your water breaks.  You lose your mucus plug. Get help right away if you:  Have a severe headache that does not go away.  Have changes in your vision.  Have severe pain in your upper belly.  Do not feel the baby move.  Have bright red bleeding. Summary  The first stage of labor starts when true labor begins, and it ends when your cervix is dilated to 10 cm.  The first stage of labor has three phases: early, active, and transitional.  Your baby moves into the birth canal during the first stage of labor.  You may have contractions that become stronger and longer. You may also lose your mucus plug and have your water break.  Call your birth care provider when your contractions are frequent and strong enough to go to the hospital or birth center. This information is not intended to replace advice given to you by your health care provider. Make sure you discuss any questions you have with your health care provider. Document Revised: 11/22/2018 Document Reviewed: 10/15/2017 Elsevier Patient Education  [place].   Vaginal Delivery  Vaginal delivery means that you give birth by pushing your baby out of your birth canal (vagina). A team of health care providers will help you before, during, and after vaginal delivery. Birth experiences are unique for every woman and every  pregnancy, and birth experiences vary depending on where you choose to give birth. What happens when I arrive at the birth center or hospital? Once you are in labor and have been admitted into the hospital or birth center, your health care provider may:  Review your pregnancy history and any concerns that you have.  Insert an IV into one of your veins. This may be used to give you fluids and medicines.  Check your blood pressure, pulse, temperature, and heart rate (vital signs).  Check whether your bag of water (amniotic sac) has broken (ruptured).  Talk with you about your birth plan and discuss pain control options. Monitoring Your health care provider may monitor your contractions (uterine monitoring) and your baby's heart rate (fetal monitoring). You may need to be monitored:  Often, but not continuously (intermittently).  All the time or for long periods at a time (continuously). Continuous monitoring may be  needed if: ? You are taking certain medicines, such as medicine to relieve pain or make your contractions stronger. ? You have pregnancy or labor complications. Monitoring may be done by:  Placing a special stethoscope or a handheld monitoring device on your abdomen to check your baby's heartbeat and to check for contractions.  Placing monitors on your abdomen (external monitors) to record your baby's heartbeat and the frequency and length of contractions.  Placing monitors inside your uterus through your vagina (internal monitors) to record your baby's heartbeat and the frequency, length, and strength of your contractions. Depending on the type of monitor, it may remain in your uterus or on your baby's head until birth.  Telemetry. This is a type of continuous monitoring that can be done with external or internal monitors. Instead of having to stay in bed, you are able to move around during telemetry. Physical exam Your health care provider may perform frequent physical  exams. This may include:  Checking how and where your baby is positioned in your uterus.  Checking your cervix to determine: ? Whether it is thinning out (effacing). ? Whether it is opening up (dilating). What happens during labor and delivery? Normal labor and delivery is divided into the following three stages: Stage 1  This is the longest stage of labor.  This stage can last for hours or days.  Throughout this stage, you will feel contractions. Contractions generally feel mild, infrequent, and irregular at first. They get stronger, more frequent (about every 2-3 minutes), and more regular as you move through this stage.  This stage ends when your cervix is completely dilated to 4 inches (10 cm) and completely effaced. Stage 2  This stage starts once your cervix is completely effaced and dilated and lasts until the delivery of your baby.  This stage may last from 20 minutes to 2 hours.  This is the stage where you will feel an urge to push your baby out of your vagina.  You may feel stretching and burning pain, especially when the widest part of your baby's head passes through the vaginal opening (crowning).  Once your baby is delivered, the umbilical cord will be clamped and cut. This usually occurs after waiting a period of 1-2 minutes after delivery.  Your baby will be placed on your bare chest (skin-to-skin contact) in an upright position and covered with a warm blanket. Watch your baby for feeding cues, like rooting or sucking, and help the baby to your breast for his or her first feeding. Stage 3  This stage starts immediately after the birth of your baby and ends after you deliver the placenta.  This stage may take anywhere from 5 to 30 minutes.  After your baby has been delivered, you will feel contractions as your body expels the placenta and your uterus contracts to control bleeding.   What can I expect after labor and delivery?  After labor is over, you and your  baby will be monitored closely until you are ready to go home to ensure that you are both healthy. Your health care team will teach you how to care for yourself and your baby.  You and your baby will stay in the same room (rooming in) during your hospital stay. This will encourage early bonding and successful breastfeeding.  You may continue to receive fluids and medicines through an IV.  Your uterus will be checked and massaged regularly (fundal massage).  You will have some soreness and pain in your abdomen,  your abdomen, vagina, and the area of skin between your vaginal opening and your anus (perineum).  If an incision was made near your vagina (episiotomy) or if you had some vaginal tearing during delivery, cold compresses may be placed on your episiotomy or your tear. This helps to reduce pain and swelling.  You may be given a squirt bottle to use instead of wiping when you go to the bathroom. To use the squirt bottle, follow these steps: ? Before you urinate, fill the squirt bottle with warm water. Do not use hot water. ? After you urinate, while you are sitting on the toilet, use the squirt bottle to rinse the area around your urethra and vaginal opening. This rinses away any urine and blood. ? Fill the squirt bottle with clean water every time you use the bathroom.  It is normal to have vaginal bleeding after delivery. Wear a sanitary pad for vaginal bleeding and discharge. Summary  Vaginal delivery means that you will give birth by pushing your baby out of your birth canal (vagina).  Your health care provider may monitor your contractions (uterine monitoring) and your baby's heart rate (fetal monitoring).  Your health care provider may perform a physical exam.  Normal labor and delivery is divided into three stages.  After labor is over, you and your baby will be monitored closely until you are ready to go home. This information is not intended to replace advice given to you by your health  care provider. Make sure you discuss any questions you have with your health care provider. Document Revised: 09/05/2017 Document Reviewed: 09/05/2017 Elsevier Patient Education  2021 [employer]    Third Trimester of Pregnancy  The third trimester of pregnancy is from week 28 through week 40. This is also called months 7 through 9. This trimester is when your unborn baby (fetus) is growing very fast. At the end of the ninth month, the unborn baby is about 20 inches long. It weighs about 6-10 pounds. Body changes during your third trimester Your body continues to go through many changes during this time. The changes vary and generally return to normal after the baby is born. Physical changes  Your weight will continue to increase. You may gain 25-35 pounds (11-16 kg) by the end of the pregnancy. If you are underweight, you may gain 28-40 lb (about 13-18 kg). If you are overweight, you may gain 15-25 lb (about 7-11 kg).  You may start to get stretch marks on your hips, belly (abdomen), and breasts.  Your breasts will continue to grow and may hurt. A yellow fluid (colostrum) may leak from your breasts. This is the first milk you are making for your baby.  You may have changes in your hair.  Your belly button may stick out.  You may have more swelling in your hands, face, or ankles. Health changes  You may have heartburn.  You may have trouble pooping (constipation).  You may get hemorrhoids. These are swollen veins in the butt that can itch or get painful.  You may have swollen veins (varicose veins) in your legs.  You may have more body aches in the pelvis, back, or thighs.  You may have more tingling or numbness in your hands, arms, and legs. The skin on your belly may also feel numb.  You may feel short of breath as your womb (uterus) gets bigger. Other changes  You may pee (urinate) more often.  You may have more problems sleeping.  You   may notice the unborn baby  "dropping," or moving lower in your belly.  You may have more discharge coming from your vagina.  Your joints may feel loose, and you may have pain around your pelvic bone. Follow these instructions at home: Medicines  Take over-the-counter and prescription medicines only as told by your doctor. Some medicines are not safe during pregnancy.  Take a prenatal vitamin that contains at least 600 micrograms (mcg) of folic acid. Eating and drinking  Eat healthy meals that include: ? Fresh fruits and vegetables. ? Whole grains. ? Good sources of protein, such as meat, eggs, or tofu. ? Low-fat dairy products.  Avoid raw meat and unpasteurized juice, milk, and cheese. These carry germs that can harm you and your baby.  Eat 4 or 5 small meals rather than 3 large meals a day.  You may need to take these actions to prevent or treat trouble pooping: ? Drink enough fluids to keep your pee (urine) pale yellow. ? Eat foods that are high in fiber. These include beans, whole grains, and fresh fruits and vegetables. ? Limit foods that are high in fat and sugar. These include fried or sweet foods. Activity  Exercise only as told by your doctor. Stop exercising if you start to have cramps in your womb.  Avoid heavy lifting.  Do not exercise if it is too hot or too humid, or if you are in a place of great height (high altitude).  If you choose to, you may have sex unless your doctor tells you not to. Relieving pain and discomfort  Take breaks often, and rest with your legs raised (elevated) if you have leg cramps or low back pain.  Take warm water baths (sitz baths) to soothe pain or discomfort caused by hemorrhoids. Use hemorrhoid cream if your doctor approves.  Wear a good support bra if your breasts are tender.  If you develop bulging, swollen veins in your legs: ? Wear support hose as told by your doctor. ? Raise your feet for 15 minutes, 3-4 times a day. ? Limit salt in your  food. Safety  Talk to your doctor before traveling far distances.  Do not use hot tubs, steam rooms, or saunas.  Wear your seat belt at all times when you are in a car.  Talk with your doctor if someone is hurting you or yelling at you a lot. Preparing for your baby's arrival To prepare for the arrival of your baby:  Take prenatal classes.  Visit the hospital and tour the maternity area.  Buy a rear-facing car seat. Learn how to install it in your car.  Prepare the baby's room. Take out all pillows and stuffed animals from the baby's crib. General instructions  Avoid cat litter boxes and soil used by cats. These carry germs that can cause harm to the baby and can cause a loss of your baby by miscarriage or stillbirth.  Do not douche or use tampons. Do not use scented sanitary pads.  Do not smoke or use any products that contain nicotine or tobacco. If you need help quitting, ask your doctor.  Do not drink alcohol.  Do not use herbal medicines, illegal drugs, or medicines that were not approved by your doctor. Chemicals in these products can affect your baby.  Keep all follow-up visits. This is important. Where to find more information  American Pregnancy Association: americanpregnancy.org  American College of Obstetricians and Gynecologists: www.acog.org  Office on Women's Health: womenshealth.gov/pregnancy Contact a doctor   You have a fever.  You have mild cramps or pressure in your lower belly.  You have a nagging pain in your belly area.  You vomit, or you have watery poop (diarrhea).  You have bad-smelling fluid coming from your vagina.  You have pain when you pee, or your pee smells bad.  You have a headache that does not go away when you take medicine.  You have changes in how you see, or you see spots in front of your eyes. Get help right away if:  Your water breaks.  You have regular contractions that are less than 5 minutes apart.  You are  spotting or bleeding from your vagina.  You have very bad belly cramps or pain.  You have trouble breathing.  You have chest pain.  You faint.  You have not felt the baby move for the amount of time told by your doctor.  You have new or increased pain, swelling, or redness in an arm or leg. Summary  The third trimester is from week 28 through week 40 (months 7 through 9). This is the time when your unborn baby is growing very fast.  During this time, your discomfort may increase as you gain weight and as your baby grows.  Get ready for your baby to arrive by taking prenatal classes, buying a rear-facing car seat, and preparing the baby's room.  Get help right away if you are bleeding from your vagina, you have chest pain and trouble breathing, or you have not felt the baby move for the amount of time told by your doctor. This information is not intended to replace advice given to you by your health care provider. Make sure you discuss any questions you have with your health care provider. Document Revised: 01/08/2020 Document Reviewed: 11/14/2019 Elsevier Patient Education  2021 [employer].

## 2020-10-18 ENCOUNTER — Inpatient Hospital Stay
Admission: EM | Admit: 2020-10-18 | Discharge: 2020-10-21 | DRG: 807 | Disposition: A | Discharge status: Home / Self Care | Payer: BC Managed Care – PPO | Attending: Certified Nurse Midwife | Admitting: Certified Nurse Midwife

## 2020-10-18 ENCOUNTER — Encounter: Payer: Self-pay | Admitting: Obstetrics and Gynecology

## 2020-10-18 ENCOUNTER — Other Ambulatory Visit: Payer: Self-pay

## 2020-10-18 DIAGNOSIS — Z3A41 41 weeks gestation of pregnancy: Secondary | ICD-10-CM

## 2020-10-18 DIAGNOSIS — O480 Post-term pregnancy: Secondary | ICD-10-CM | POA: Diagnosis present

## 2020-10-18 DIAGNOSIS — S3141XA Laceration without foreign body of vagina and vulva, initial encounter: Secondary | ICD-10-CM | POA: Diagnosis not present

## 2020-10-18 DIAGNOSIS — Z8616 Personal history of COVID-19: Secondary | ICD-10-CM

## 2020-10-18 DIAGNOSIS — Z6710 Type A blood, Rh positive: Secondary | ICD-10-CM | POA: Diagnosis present

## 2020-10-18 DIAGNOSIS — Z3490 Encounter for supervision of normal pregnancy, unspecified, unspecified trimester: Secondary | ICD-10-CM | POA: Diagnosis present

## 2020-10-18 LAB — CBC
HCT: 37.9 % (ref 36.0–46.0)
Hemoglobin: 12.5 g/dL (ref 12.0–15.0)
MCH: 30.7 pg (ref 26.0–34.0)
MCHC: 33 g/dL (ref 30.0–36.0)
MCV: 93.1 fL (ref 80.0–100.0)
Platelets: 223 10*3/uL (ref 150–400)
RBC: 4.07 MIL/uL (ref 3.87–5.11)
RDW: 12.8 % (ref 11.5–15.5)
WBC: 11 10*3/uL — ABNORMAL HIGH (ref 4.0–10.5)
nRBC: 0 % (ref 0.0–0.2)

## 2020-10-18 LAB — TYPE AND SCREEN
ABO/RH(D): A POS
Antibody Screen: NEGATIVE

## 2020-10-18 LAB — ABO/RH: ABO/RH(D): A POS

## 2020-10-18 MED ORDER — LIDOCAINE HCL (PF) 1 % IJ SOLN
30.0000 mL | INTRAMUSCULAR | Status: AC | PRN
  Administered 2020-10-19: 30 mL via SUBCUTANEOUS

## 2020-10-18 MED ORDER — AMMONIA AROMATIC IN INHA
RESPIRATORY_TRACT | Status: AC
  Filled 2020-10-18: qty 10

## 2020-10-18 MED ORDER — SOD CITRATE-CITRIC ACID 500-334 MG/5ML PO SOLN: 30.0000 mL | ORAL | Status: DC | PRN

## 2020-10-18 MED ORDER — LIDOCAINE HCL (PF) 1 % IJ SOLN
INTRAMUSCULAR | Status: AC
  Filled 2020-10-18: qty 30

## 2020-10-18 MED ORDER — ACETAMINOPHEN 325 MG PO TABS
650.0000 mg | ORAL_TABLET | ORAL | Status: DC | PRN
  Administered 2020-10-19 (×2): 650 mg via ORAL
  Filled 2020-10-18 (×2): qty 2

## 2020-10-18 MED ORDER — OXYTOCIN 10 UNIT/ML IJ SOLN
INTRAMUSCULAR | Status: AC
  Filled 2020-10-18: qty 2

## 2020-10-18 MED ORDER — MISOPROSTOL 200 MCG PO TABS
ORAL_TABLET | ORAL | Status: AC
  Administered 2020-10-19: 800 ug via RECTAL
  Filled 2020-10-18: qty 4

## 2020-10-18 NOTE — OB Triage Note (Signed)
Pt G1P0 at [redacted]w[redacted]d presents for ctx for the past 5 days. Pt reports ctx have been intermittent. Reports ctx have become more regular starting a little before 12pm today. Pt reports ctx 2-5 minutes apart for the last few hours. Pt compares ctx to her period cramps. Pt reports she has had a few stronger ctx that shes had to breathe through. +FM. Denies LOF. Reports some bloody show. Pt states she had sexual intercourse today. Pt denies complications with this pregnancy. VSS. Will continue to monitor.

## 2020-10-18 NOTE — Progress Notes (Signed)
Patient ID: Linda Mendez, female   DOB: 1995-08-18, 25 y.o.   MRN: [id number]  Linda Mendez is a 25 y.o. G1P0 at [redacted]w[redacted]d by LMP admitted for active labor  Subjective:  Patient in birthing tub, breathing through contraction. Husband at side for continuous labor support.   Denies difficulty breathing or respiratory distress, chest pain, dysuria, and leg pain or swelling.   Objective:  Temp:  [97.8 F (36.6 C)-98.1 F (36.7 C)] 97.8 F (36.6 C) (03/06 1940) Pulse Rate:  [90-92] 92 (03/06 1940) Resp:  [17-18] 18 (03/06 1940) BP: (127)/(80) 127/80 (03/06 1527) Weight:  [67.1 kg] 67.1 kg (03/06 1522)'  Fetal Wellbeing:  Category I   UC:   regular, every two (2) to four (4) minutes; soft resting tone  SVE:   Dilation: 6.5 Effacement (%): 90 Exam by:: Rumi Taras, CNM  Labs: Lab Results  Component Value Date   WBC 11.0 (H) 10/18/2020   HGB 12.5 10/18/2020   HCT 37.9 10/18/2020   MCV 93.1 10/18/2020   PLT 223 10/18/2020    Assessment:  Linda Mendez is a 25 y.o. G1P0 at [redacted]w[redacted]d admitted for labor, Rh positive, GBS negative  FHR Category I   Plan:  Encouraged position change and use of peanut ball.   Advised patient to exit tub and empty bladder, agrees with suggestion.   Reviewed red flag symptoms and when to call.   Continue orders as written. Reassess as needed.    [name], CNM [facility], [facility] 10/18/2020, 10:07 PM

## 2020-10-18 NOTE — H&P (Signed)
Obstetric History and Physical  Nayleah Bartel is a 25 y.o. G1P0 with IUP at [redacted]w[redacted]d presenting with regular painful contractions.   Patient states she has been having  regular, every five (5) minutes contractions, minimal vaginal bleeding, intact membranes, with active fetal movement.    Denies difficulty breathing or respiratory distress, chest pain, dysuria, and leg pain.   Prenatal Course  Source of Care: [facility]-Initial visit at 11 weeks, total visits: 14   Pregnancy complications or risks: Postdates  Prenatal labs and studies:  ABO, Rh: A/Positive/-- 04-03-2023 1123)  Antibody: Negative 04-03-2023 1123)  Rubella: 1.78 04-03-23 1123)  Varicella: 415 04/03/23 1123)  RPR: Non Reactive (12/13 1508)   HBsAg: Negative 04-03-2023 1123)   HIV: Non Reactive 04/03/2023 1123)   GBS:--/Negative (02/15 1643)  1 hr Glucola: 102 (12/13 1508)  Genetic screening: Declined  Anatomy US: Complete, normal (10/07 1444)  Past Medical History:  Diagnosis Date  . Arm fracture, left   . Hemorrhoids   . Mononucleosis     Past Surgical History:  Procedure Laterality Date  . arm surgery    . blocked tear duct surgery    . FRACTURE SURGERY Left 2003   Left arm     OB History  Gravida Para Term Preterm AB Living  1            SAB IAB Ectopic Multiple Live Births               # Outcome Date GA Lbr Len/2nd Weight Sex Delivery Anes PTL Lv  1 Current             Social History   Socioeconomic History  . Marital status: Married    Spouse name: Not on file  . Number of children: Not on file  . Years of education: Not on file  . Highest education level: Not on file  Occupational History  . Not on file  Tobacco Use  . Smoking status: Never Smoker  . Smokeless tobacco: Never Used  Vaping Use  . Vaping Use: Never used  Substance and Sexual Activity  . Alcohol use: Not Currently    Comment: Rarely drank before pregnancy.  . Drug use: No  . Sexual activity: Yes    Partners: Male    Birth  control/protection: None  Other Topics Concern  . Not on file  Social History Narrative  . Not on file   Social Determinants of Health   Financial Resource Strain: Not on file  Food Insecurity: Not on file  Transportation Needs: Not on file  Physical Activity: Not on file  Stress: Not on file  Social Connections: Not on file    Family History  Problem Relation Age of Onset  . Thyroid disease Mother   . Migraines Mother   . Hypersomnolence Mother   . Hyperlipidemia Father   . Hyperlipidemia Paternal Grandfather   . Skin cancer Maternal Grandmother   . Skin cancer Paternal Grandmother     Medications Prior to Admission  Medication Sig Dispense Refill Last Dose  . acetaminophen (TYLENOL) 500 MG tablet Take 500 mg by mouth every 6 (six) hours as needed.   10/18/2020 at Unknown time  . calcium carbonate (TUMS - DOSED IN MG ELEMENTAL CALCIUM) 500 MG chewable tablet Chew 1 tablet by mouth daily.   Past Week at Unknown time  . Prenatal Vit-Fe Fumarate-FA (PRENATAL VITAMIN PO) Take by mouth daily.   10/17/2020 at Unknown time  . Misc. Devices (BREAST PUMP) MISC Dispense  one breast pump for patient 1 each 0     Allergies  Allergen Reactions  . Amoxicillin Hives  . Penicillins Hives    Review of Systems: Negative except for what is mentioned in HPI.  Physical Exam:  Temp:  [98.1 F (36.7 C)] 98.1 F (36.7 C) (03/06 1527) Pulse Rate:  [90] 90 (03/06 1527) Resp:  [17] 17 (03/06 1522) BP: (127)/(80) 127/80 (03/06 1527) Weight:  [67.1 kg] 67.1 kg (03/06 1522)  GENERAL: Well-developed, well-nourished female in no acute distress.   LUNGS: Clear to auscultation bilaterally.   HEART: Regular rate and rhythm.  ABDOMEN: Soft, nontender, nondistended, gravid.  EXTREMITIES: Nontender, no edema, 2+ distal pulses.  Cervical Exam: Dilation: 5 Effacement (%): 90 Cervical Position: Middle Exam by:: Lawhorn CNM  FHR Category I  Contractions: Every two (2) to three (3) minutes,  soft resting tone   Pertinent Labs/Studies:   No results found for this or any previous visit (from the past 24 hour(s)).  Assessment :  Lindsea Olivar is a 25 y.o. G1P0 at [redacted]w[redacted]d being admitted for labor, Rh positive, GBS negative  FHR Category I  Plan:  Admit to birthing suites, see orders.   Labor: Expectant management.  Induction/Augmentation as needed, per protocol.  Delivery plan: Hopeful for vaginal birth in waterbirth tub.   Dr. Logan Bores notified for admission and plan of care.    Gunnar Bulla, CNM [facility], [facility] 10/18/20 6:59 PM

## 2020-10-19 ENCOUNTER — Inpatient Hospital Stay: Payer: BC Managed Care – PPO | Admitting: Anesthesiology

## 2020-10-19 ENCOUNTER — Encounter: Payer: Self-pay | Admitting: Certified Nurse Midwife

## 2020-10-19 DIAGNOSIS — S3141XA Laceration without foreign body of vagina and vulva, initial encounter: Secondary | ICD-10-CM | POA: Diagnosis not present

## 2020-10-19 DIAGNOSIS — Z3A41 41 weeks gestation of pregnancy: Secondary | ICD-10-CM | POA: Diagnosis not present

## 2020-10-19 DIAGNOSIS — O480 Post-term pregnancy: Principal | ICD-10-CM

## 2020-10-19 LAB — RPR: RPR Ser Ql: NONREACTIVE

## 2020-10-19 MED ORDER — WITCH HAZEL-GLYCERIN EX PADS
1.0000 "application " | MEDICATED_PAD | CUTANEOUS | Status: DC | PRN
  Filled 2020-10-19 (×2): qty 100

## 2020-10-19 MED ORDER — PHENYLEPHRINE 40 MCG/ML (10ML) SYRINGE FOR IV PUSH (FOR BLOOD PRESSURE SUPPORT): 80.0000 ug | PREFILLED_SYRINGE | INTRAVENOUS | Status: DC | PRN

## 2020-10-19 MED ORDER — ONDANSETRON HCL 4 MG/2ML IJ SOLN
4.0000 mg | Freq: Four times a day (QID) | INTRAMUSCULAR | Status: DC | PRN
Start: 2020-10-19 — End: 2020-10-19

## 2020-10-19 MED ORDER — ZOLPIDEM TARTRATE 5 MG PO TABS: 5.0000 mg | ORAL_TABLET | Freq: Every evening | ORAL | Status: DC | PRN

## 2020-10-19 MED ORDER — DIPHENHYDRAMINE HCL 50 MG/ML IJ SOLN: 12.5000 mg | INTRAMUSCULAR | Status: DC | PRN

## 2020-10-19 MED ORDER — BUTORPHANOL TARTRATE 1 MG/ML IJ SOLN: 1.0000 mg | INTRAMUSCULAR | Status: DC | PRN

## 2020-10-19 MED ORDER — EPHEDRINE 5 MG/ML INJ: 10.0000 mg | INTRAVENOUS | Status: DC | PRN

## 2020-10-19 MED ORDER — METHYLERGONOVINE MALEATE 0.2 MG PO TABS
0.2000 mg | ORAL_TABLET | ORAL | Status: DC | PRN
  Filled 2020-10-19: qty 1

## 2020-10-19 MED ORDER — LACTATED RINGERS IV SOLN: INTRAVENOUS | Status: DC

## 2020-10-19 MED ORDER — SODIUM CHLORIDE 0.9% FLUSH: 3.0000 mL | Freq: Two times a day (BID) | INTRAVENOUS | Status: DC

## 2020-10-19 MED ORDER — SODIUM CHLORIDE 0.9% FLUSH
3.0000 mL | Freq: Two times a day (BID) | INTRAVENOUS | Status: DC
Start: 2020-10-19 — End: 2020-10-21
  Administered 2020-10-20: 3 mL via INTRAVENOUS

## 2020-10-19 MED ORDER — BENZOCAINE-MENTHOL 20-0.5 % EX AERO
1.0000 "application " | INHALATION_SPRAY | CUTANEOUS | Status: DC | PRN
  Filled 2020-10-19 (×2): qty 56

## 2020-10-19 MED ORDER — MISOPROSTOL 50MCG HALF TABLET: 50.0000 ug | ORAL_TABLET | ORAL | Status: DC

## 2020-10-19 MED ORDER — LIDOCAINE-EPINEPHRINE (PF) 1.5 %-1:200000 IJ SOLN
INTRAMUSCULAR | Status: DC | PRN
  Administered 2020-10-19: 3 mL via EPIDURAL

## 2020-10-19 MED ORDER — SODIUM CHLORIDE 0.9 % IV SOLN
INTRAVENOUS | Status: DC | PRN
  Administered 2020-10-19 (×2): 5 mL via EPIDURAL

## 2020-10-19 MED ORDER — MISOPROSTOL 200 MCG PO TABS: 800.0000 ug | ORAL_TABLET | Freq: Once | ORAL | Status: AC

## 2020-10-19 MED ORDER — OXYTOCIN-SODIUM CHLORIDE 30-0.9 UT/500ML-% IV SOLN
INTRAVENOUS | Status: AC
  Filled 2020-10-19: qty 500

## 2020-10-19 MED ORDER — LACTATED RINGERS IV SOLN
500.0000 mL | Freq: Once | INTRAVENOUS | Status: AC
  Administered 2020-10-19: 500 mL via INTRAVENOUS

## 2020-10-19 MED ORDER — FENTANYL 2.5 MCG/ML W/ROPIVACAINE 0.15% IN NS 100 ML EPIDURAL (ARMC)
EPIDURAL | Status: AC
  Filled 2020-10-19: qty 100

## 2020-10-19 MED ORDER — SODIUM CHLORIDE 0.9% FLUSH: 3.0000 mL | INTRAVENOUS | Status: DC | PRN

## 2020-10-19 MED ORDER — BENZOCAINE-MENTHOL 20-0.5 % EX AERO
INHALATION_SPRAY | CUTANEOUS | Status: AC
  Filled 2020-10-19: qty 56

## 2020-10-19 MED ORDER — LIDOCAINE HCL (PF) 1 % IJ SOLN
INTRAMUSCULAR | Status: DC | PRN
  Administered 2020-10-19: 2 mL via SUBCUTANEOUS

## 2020-10-19 MED ORDER — DIBUCAINE (PERIANAL) 1 % EX OINT
1.0000 "application " | TOPICAL_OINTMENT | CUTANEOUS | Status: DC | PRN
  Filled 2020-10-19 (×2): qty 28

## 2020-10-19 MED ORDER — LACTATED RINGERS IV SOLN: 500.0000 mL | INTRAVENOUS | Status: DC | PRN

## 2020-10-19 MED ORDER — OXYCODONE HCL 5 MG PO TABS: 10.0000 mg | ORAL_TABLET | ORAL | Status: DC | PRN

## 2020-10-19 MED ORDER — OXYCODONE HCL 5 MG PO TABS: 5.0000 mg | ORAL_TABLET | ORAL | Status: DC | PRN

## 2020-10-19 MED ORDER — COCONUT OIL OIL
1.0000 "application " | TOPICAL_OIL | Status: DC | PRN
  Filled 2020-10-19: qty 120

## 2020-10-19 MED ORDER — OXYTOCIN BOLUS FROM INFUSION
333.0000 mL | Freq: Once | INTRAVENOUS | Status: AC
  Administered 2020-10-19: 333 mL via INTRAVENOUS

## 2020-10-19 MED ORDER — SIMETHICONE 80 MG PO CHEW: 80.0000 mg | CHEWABLE_TABLET | ORAL | Status: DC | PRN

## 2020-10-19 MED ORDER — ONDANSETRON HCL 4 MG PO TABS
4.0000 mg | ORAL_TABLET | ORAL | Status: DC | PRN
  Filled 2020-10-19: qty 1

## 2020-10-19 MED ORDER — SODIUM CHLORIDE 0.9 % IV SOLN: 250.0000 mL | INTRAVENOUS | Status: DC | PRN

## 2020-10-19 MED ORDER — IBUPROFEN 600 MG PO TABS
600.0000 mg | ORAL_TABLET | Freq: Four times a day (QID) | ORAL | Status: DC
  Administered 2020-10-19 – 2020-10-20 (×2): 600 mg via ORAL
  Filled 2020-10-19 (×2): qty 1

## 2020-10-19 MED ORDER — OXYTOCIN-SODIUM CHLORIDE 30-0.9 UT/500ML-% IV SOLN
2.5000 [IU]/h | INTRAVENOUS | Status: DC
  Administered 2020-10-19: 2.5 [IU]/h via INTRAVENOUS

## 2020-10-19 MED ORDER — FENTANYL 2.5 MCG/ML W/ROPIVACAINE 0.15% IN NS 100 ML EPIDURAL (ARMC)
12.0000 mL/h | EPIDURAL | Status: DC
Start: 2020-10-19 — End: 2020-10-19
  Administered 2020-10-19 (×2): 12 mL/h via EPIDURAL
  Filled 2020-10-19: qty 100

## 2020-10-19 MED ORDER — PRENATAL MULTIVITAMIN CH
1.0000 | ORAL_TABLET | Freq: Every day | ORAL | Status: DC
Start: 2020-10-20 — End: 2020-10-21
  Administered 2020-10-20: 1 via ORAL
  Filled 2020-10-19: qty 1

## 2020-10-19 MED ORDER — ACETAMINOPHEN 500 MG PO TABS
1000.0000 mg | ORAL_TABLET | Freq: Four times a day (QID) | ORAL | Status: DC | PRN
  Administered 2020-10-20 (×3): 1000 mg via ORAL
  Filled 2020-10-19 (×3): qty 2

## 2020-10-19 MED ORDER — METHYLERGONOVINE MALEATE 0.2 MG/ML IJ SOLN: 0.2000 mg | INTRAMUSCULAR | Status: DC | PRN

## 2020-10-19 MED ORDER — ONDANSETRON HCL 4 MG/2ML IJ SOLN: 4.0000 mg | INTRAMUSCULAR | Status: DC | PRN

## 2020-10-19 MED ORDER — SENNOSIDES-DOCUSATE SODIUM 8.6-50 MG PO TABS
2.0000 | ORAL_TABLET | ORAL | Status: DC
  Administered 2020-10-19: 2 via ORAL
  Filled 2020-10-19 (×2): qty 2

## 2020-10-19 MED ORDER — OXYTOCIN-SODIUM CHLORIDE 30-0.9 UT/500ML-% IV SOLN
INTRAVENOUS | Status: AC
  Administered 2020-10-19: 2 m[IU]/min via INTRAVENOUS
  Filled 2020-10-19: qty 500

## 2020-10-19 MED ORDER — DIPHENHYDRAMINE HCL 25 MG PO CAPS: 25.0000 mg | ORAL_CAPSULE | Freq: Four times a day (QID) | ORAL | Status: DC | PRN

## 2020-10-19 MED ORDER — OXYTOCIN-SODIUM CHLORIDE 30-0.9 UT/500ML-% IV SOLN: 1.0000 m[IU]/min | INTRAVENOUS | Status: DC

## 2020-10-19 MED ORDER — TERBUTALINE SULFATE 1 MG/ML IJ SOLN: 0.2500 mg | Freq: Once | INTRAMUSCULAR | Status: DC | PRN

## 2020-10-19 NOTE — Anesthesia Procedure Notes (Signed)
Epidural Patient location during procedure: OB Start time: 10/19/2020 3:19 AM End time: 10/19/2020 3:23 AM  Staffing Anesthesiologist: Lenard Simmer, MD Performed: anesthesiologist   Preanesthetic Checklist Completed: patient identified, IV checked, site marked, risks and benefits discussed, surgical consent, monitors and equipment checked, pre-op evaluation and timeout performed  Epidural Patient position: sitting Prep: ChloraPrep Patient monitoring: heart rate, continuous pulse ox and blood pressure Approach: midline Location: L3-L4 Injection technique: LOR saline  Needle:  Needle type: Tuohy  Needle gauge: 17 G Needle length: 9 cm and 9 Needle insertion depth: 4 cm Catheter type: closed end flexible Catheter size: 19 Gauge Catheter at skin depth: 9 cm Test dose: negative and 1.5% lidocaine with Epi 1:200 K  Assessment Sensory level: T10 Events: blood not aspirated, injection not painful, no injection resistance, no paresthesia and negative IV test  Additional Notes 1st attempt Pt. Evaluated and documentation done after procedure finished. Patient identified. Risks/Benefits/Options discussed with patient including but not limited to bleeding, infection, nerve damage, paralysis, failed block, incomplete pain control, headache, blood pressure changes, nausea, vomiting, reactions to medication both or allergic, itching and postpartum back pain. Confirmed with bedside nurse the patient's most recent platelet count. Confirmed with patient that they are not currently taking any anticoagulation, have any bleeding history or any family history of bleeding disorders. Patient expressed understanding and wished to proceed. All questions were answered. Sterile technique was used throughout the entire procedure. Please see nursing notes for vital signs. Test dose was given through epidural catheter and negative prior to continuing to dose epidural or start infusion. Warning signs of high block  given to the patient including shortness of breath, tingling/numbness in hands, complete motor block, or any concerning symptoms with instructions to call for help. Patient was given instructions on fall risk and not to get out of bed. All questions and concerns addressed with instructions to call with any issues or inadequate analgesia.   Patient tolerated the insertion well without immediate complications.Reason for block:procedure for pain

## 2020-10-19 NOTE — Progress Notes (Signed)
Patient ID: Linda Mendez, female   DOB: 1996/06/03, 25 y.o.   MRN: [id number]   Linda Mendez is a 25 y.o. G1P0 at [redacted]w[redacted]d by LMP admitted for active labor  Subjective:  Patient reports fatigue and pelvic pressure with the urge to push. Spouse at bedside for continuous labor support.   Denies difficulty breathing or respiratory distress, chest pain, dysuria, and leg pain.   Objective:  Temp:  [97 F (36.1 C)-100.8 F (38.2 C)] (P) 98.5 F (36.9 C) (03/07 1502) Pulse Rate:  [63-120] 103 (03/07 1315) Resp:  [17-18] 18 (03/07 0716) BP: (85-133)/(50-84) 121/82 (03/07 1315) SpO2:  [91 %-100 %] 97 % (03/07 1500)  Fetal Wellbeing:  Category II  UC:   regular, every one (1) to four (4) minutes; soft resting tone, pit 6 mu/min  SVE:   Dilation: 10 Effacement (%): 100 Station: -1,0 Exam by:: Michelle Lawhorn,CNM  Labs: Lab Results  Component Value Date   WBC 11.0 (H) 10/18/2020   HGB 12.5 10/18/2020   HCT 37.9 10/18/2020   MCV 93.1 10/18/2020   PLT 223 10/18/2020    Assessment:  Linda Mendez a 25 y.o.G1P0 at [redacted]w[redacted]d admitted for labor, Rh positive, GBS negative  FHR Category II   Plan:  Discussed options for management of prolonged second stage including rest, continued pushing, surgical birth or evaluation by MD for possible vacuum assistance.   Dr. Valentino Saxon called for bedside evaluate of maternal pushing efforts, fetal head position and possible use of vacuum assistance.   Reviewed red flag symptoms and when to call.   Continue orders as written. Reassess as needed.    Linda Mendez, CNM [facility], [facility] 10/19/2020, 4:41 PM

## 2020-10-19 NOTE — Anesthesia Preprocedure Evaluation (Signed)
Anesthesia Evaluation  Patient identified by MRN, date of birth, ID band Patient awake    Reviewed: Allergy & Precautions, H&P , NPO status , Patient's Chart, lab work & pertinent test results, reviewed documented beta blocker date and time   History of Anesthesia Complications Negative for: history of anesthetic complications  Airway Mallampati: I  TM Distance: >3 FB Neck ROM: full    Dental no notable dental hx. (+) Teeth Intact Permanent retainer on the bottom:   Pulmonary neg pulmonary ROS,    Pulmonary exam normal breath sounds clear to auscultation       Cardiovascular Exercise Tolerance: Good negative cardio ROS Normal cardiovascular exam Rhythm:regular Rate:Normal     Neuro/Psych negative neurological ROS  negative psych ROS   GI/Hepatic Neg liver ROS, GERD  ,  Endo/Other  negative endocrine ROS  Renal/GU negative Renal ROS  negative genitourinary   Musculoskeletal   Abdominal   Peds  Hematology negative hematology ROS (+)   Anesthesia Other Findings Past Medical History: No date: Arm fracture, left No date: Hemorrhoids No date: Mononucleosis   Reproductive/Obstetrics (+) Pregnancy                             Anesthesia Physical Anesthesia Plan  ASA: II  Anesthesia Plan: Epidural   Post-op Pain Management:    Induction:   PONV Risk Score and Plan:   Airway Management Planned:   Additional Equipment:   Intra-op Plan:   Post-operative Plan:   Informed Consent: I have reviewed the patients History and Physical, chart, labs and discussed the procedure including the risks, benefits and alternatives for the proposed anesthesia with the patient or authorized representative who has indicated his/her understanding and acceptance.     Dental Advisory Given  Plan Discussed with: Anesthesiologist, CRNA and Surgeon  Anesthesia Plan Comments:         Anesthesia  Quick Evaluation

## 2020-10-19 NOTE — Progress Notes (Signed)
Patient ID: [name], female   DOB: 09-Nov-1995, 25 y.o.   MRN: [id number]  Maricia Scotti is a 25 y.o. G1P0 at [redacted]w[redacted]d by LMP admitted for active labor  Subjective:  Patient requests SVE. Reports decreased frequency and strength of contractions. Spouse at bedside for continuous labor support.   Denies difficulty breathing or respiratory distress, chest pain, dysuria, and leg pain.   Objective:  Temp:  [97 F (36.1 C)-98.1 F (36.7 C)] 97.9 F (36.6 C) (03/07 0020) Pulse Rate:  [85-101] 100 (03/07 0020) Resp:  [17-18] 17 (03/07 0020) BP: (115-127)/(67-84) 125/84 (03/07 0020) Weight:  [67.1 kg] 67.1 kg (03/06 1522)  Fetal Wellbeing:  Category I  UC:   regular, every four (4) to six (6) minutes; soft resting tone  SVE:   Dilation: 7 Effacement (%): 90 Exam by:: Yarima Penman, CNM  Labs: Lab Results  Component Value Date   WBC 11.0 (H) 10/18/2020   HGB 12.5 10/18/2020   HCT 37.9 10/18/2020   MCV 93.1 10/18/2020   PLT 223 10/18/2020    Assessment:  Sheila Cardenis a 25 y.o.G1P0 at [redacted]w[redacted]d admitted for labor, Rh positive, GBS negative  FHR Category I  Plan:  Discussed expected and actual cervical change with patient and spouse; options for augmentation of labor reviewed in detail including watchful waiting, AROM, IV pain medication, IV pitocin and/or epidural placement.   After private discussion, patient and spouse wish to proceed with epidural anesthesia followed by AROM and/or pitocin once patient is comfortable.   RN to notify anesthesia after labor tub is dismantled.   Reviewed red flag symptoms and when to call.   Continue orders as written. Reassess as needed.    [name], CNM [facility], [facility] 10/19/2020, 1:07 AM

## 2020-10-19 NOTE — Progress Notes (Signed)
Patient ID: Linda Mendez, female   DOB: 01/21/1996, 25 y.o.   MRN: [id number]  Linda Mendez is a 25 y.o. G1P0 at [redacted]w[redacted]d by LMP admitted for active labor  Subjective:  Patient sitting in bed breathing through contractions, awaiting epidural placement. Spouse at bedside for continuous labor support.   Denies difficulty breathing or respiratory distress, chest pain, dysuria, and leg pain.   Objective:  Temp:  [97 F (36.1 C)-98.1 F (36.7 C)] 97.9 F (36.6 C) (03/07 0020) Pulse Rate:  [85-108] 108 (03/07 0220) Resp:  [17-18] 17 (03/07 0020) BP: (115-127)/(67-84) 122/82 (03/07 0220) Weight:  [67.1 kg] 67.1 kg (03/06 1522)  Fetal Wellbeing:  Category I  UC:   irregular, every one (1) to seven (7) minutes; soft resting tone  SVE:   Dilation: 7 Effacement (%): 90 Exam by:: Linda Mendez, CNM  Labs: Lab Results  Component Value Date   WBC 11.0 (H) 10/18/2020   HGB 12.5 10/18/2020   HCT 37.9 10/18/2020   MCV 93.1 10/18/2020   PLT 223 10/18/2020    Assessment:  Linda Mendez a 25 y.o.G1P0 at [redacted]w[redacted]d admitted for labor, Rh positive, GBS negative  FHR Category I  Plan:  RN to contact anesthesia provider again and notify CNM once epidural is placed and patient comfortable.   Reviewed red flag symptoms and when to call.   Continue orders as written. Reassess as needed.    Linda Mendez, CNM [facility], [facility] 10/19/2020, 3:01 AM

## 2020-10-19 NOTE — Progress Notes (Addendum)
Linda Mendez is a 25 y.o. G1P0 at [redacted]w[redacted]d by admitted for active labor  Subjective: Patient resting , laying on side. Husband at bedside for support. Comfortable with epidural, reports feeling pressure with contractions.  Denies difficulty breathing, respiratory distress, chest pain, and leg pain.  Objective:  BP (!) 101/59   Pulse 69   Temp 98.4 F (36.9 C) (Oral)   Resp 18   Ht 5\' 3"  (1.6 m)   Wt 67.1 kg   LMP 01/03/2020   SpO2 98%   BMI 26.22 kg/m  No intake/output data recorded. Total I/O In: -  Out: 150 [Urine:150]  FHT:  FHR: 150 bpm, variability: moderate,  accelerations:  Present,  decelerations:  Absent UC:   regular, every 2-3 minutes; Pit. Infusing at 17milU/min soft resting tone  AROM: Light meconium stained fluid  SVE:   Dilation: 10 Effacement (%): 100 Station: -2 Exam by:: [email or web address]: Lab Results  Component Value Date   WBC 11.0 (H) 10/18/2020   HGB 12.5 10/18/2020   HCT 37.9 10/18/2020   MCV 93.1 10/18/2020   PLT 223 10/18/2020    Assessment   Sponateous labor, progressing with augmentation   Plan:  Labor: Progressing on Pitocin, will continue to increase then AROM Fetal Wellbeing:  Category I Pain Control:  Epidural I/D:  n/a Anticipated MOD:  NSVD  12/18/2020, SNM Linda Mendez 10/19/2020, 12:22 PM

## 2020-10-19 NOTE — Progress Notes (Signed)
1735- Dr.Cherry at bedside for pt evaluation.   Valentino Saxon, MD will apply Bell Vacuum  1744- Prep room for delivery 1746- Bell vacum applied,traction applied, and pull x1 1748- Tractionapplied and pull x3 1750 - Tractin applied and pull x3 1752- traction applied and pull x2 1754- Vacuum popped off 1757- traction applied and pull x2 1800- Vacuum popped off. Not reapplied 1801- time of birth

## 2020-10-19 NOTE — Progress Notes (Signed)
Patient ID: Linda Mendez, female   DOB: [birth year], 25 y.o.   MRN: [id number]  Linda Mendez is a 25 y.o. G1P0 at [redacted]w[redacted]d by LMP admitted for active labor  Subjective:  Patient reports pain relief since epidural placed, able to take a nap. Spouse at bedside for continuous labor support.   Denies difficulty breathing or respiratory distress, chest pain, abdominal pain, dysuria, and leg pain.   Objective:  Temp:  [97 F (36.1 C)-98.5 F (36.9 C)] 98.4 F (36.9 C) (03/07 0716) Pulse Rate:  [69-120] 83 (03/07 0816) Resp:  [17-18] 18 (03/07 0716) BP: (93-127)/(50-84) 117/71 (03/07 0816) SpO2:  [95 %-100 %] 100 % (03/07 0815) Weight:  [67.1 kg] 67.1 kg (03/06 1522)  Fetal Wellbeing:  Category I  UC:   regular, every two (2) to four (4) minutes; pitocin 4 mu/min  SVE:   Dilation: 9 Effacement (%): 90 Station: -2 Exam by:: M.Lawhorn,CNM  Labs: Lab Results  Component Value Date   WBC 11.0 (H) 10/18/2020   HGB 12.5 10/18/2020   HCT 37.9 10/18/2020   MCV 93.1 10/18/2020   PLT 223 10/18/2020    Assessment:  Linda Mendez a 25 y.o.G1P0 at [redacted]w[redacted]d admitted for labor, Rh positive, GBS negative  FHR Category I  Plan:  Encouraged position change, rest and use of peanut ball.  Reviewed red flag symptoms and when to call.   Continue orders as written. Reassess as needed.    Linda Mendez, CNM [facility], [facility] 10/19/2020, 8:47 AM

## 2020-10-20 ENCOUNTER — Encounter: Payer: Self-pay | Admitting: Certified Nurse Midwife

## 2020-10-20 LAB — CBC
HCT: 26.2 % — ABNORMAL LOW (ref 36.0–46.0)
Hemoglobin: 8.8 g/dL — ABNORMAL LOW (ref 12.0–15.0)
MCH: 31.5 pg (ref 26.0–34.0)
MCHC: 33.6 g/dL (ref 30.0–36.0)
MCV: 93.9 fL (ref 80.0–100.0)
Platelets: 192 10*3/uL (ref 150–400)
RBC: 2.79 MIL/uL — ABNORMAL LOW (ref 3.87–5.11)
RDW: 13.1 % (ref 11.5–15.5)
WBC: 17 10*3/uL — ABNORMAL HIGH (ref 4.0–10.5)
nRBC: 0 % (ref 0.0–0.2)

## 2020-10-20 MED ORDER — FERROUS SULFATE 325 (65 FE) MG PO TABS
325.0000 mg | ORAL_TABLET | Freq: Three times a day (TID) | ORAL | Status: DC
  Administered 2020-10-20 – 2020-10-21 (×3): 325 mg via ORAL
  Filled 2020-10-20 (×3): qty 1

## 2020-10-20 MED ORDER — IBUPROFEN 600 MG PO TABS
600.0000 mg | ORAL_TABLET | Freq: Four times a day (QID) | ORAL | Status: DC
  Administered 2020-10-20 – 2020-10-21 (×4): 600 mg via ORAL
  Filled 2020-10-20 (×4): qty 1

## 2020-10-20 MED ORDER — DOCUSATE SODIUM 100 MG PO CAPS
100.0000 mg | ORAL_CAPSULE | Freq: Every day | ORAL | Status: DC
  Administered 2020-10-20 – 2020-10-21 (×2): 100 mg via ORAL
  Filled 2020-10-20 (×2): qty 1

## 2020-10-20 NOTE — Lactation Note (Signed)
This note was copied from a baby's chart. Lactation Consultation Note  Patient Name: Linda Mendez [id number] Date: 10/20/2020 Reason for consult: Initial assessment Age:26 hours   LC completed initial visit today, family dyad very welcoming and enjoying this moment. Mom and dad open to learning, so breastfeeding can be successful. Mom reports infants last feeing was around 10am for ,  LC also reviewed feeding sheet and notices most feeds have been around 10-59mins. Mom shared that most feedings have been unassisted, first two with assistance. Mom wanted to clarify, what a proper latch felt and looked liked and requested LC come back to observe next feeding. LC explained what it should feel like, pinch vs tug, infants head and neck position, lips and also nipple shape when infant comes off breast. LC asked about any nipple pain or damage, mom reported left nipple had a visible red blister. LC didn't see visually, mom said at next feeding it would be better to examine, LC encouraged expressing breast milk to help with healing.     LC will follow-up at next feeding to observe and assist as needed   Maternal Data Has patient been taught Hand Expression?: Yes Does the patient have breastfeeding experience prior to this delivery?: No  Feeding Mother's Current Feeding Choice: Breast Milk     Consult Status Consult Status: Follow-up Date: 10/21/20 Follow-up type: In-patient    [name] 10/20/2020, 12:54 PM

## 2020-10-20 NOTE — Anesthesia Postprocedure Evaluation (Signed)
Anesthesia Post Note  Patient: Engineer, petroleum  Procedure(s) Performed: AN AD HOC LABOR EPIDURAL  Patient location during evaluation: Mother Baby Anesthesia Type: Epidural Level of consciousness: awake and alert Pain management: pain level controlled Vital Signs Assessment: post-procedure vital signs reviewed and stable Respiratory status: spontaneous breathing, nonlabored ventilation and respiratory function stable Cardiovascular status: stable Postop Assessment: no headache, no backache and epidural receding Anesthetic complications: no   No complications documented.   Last Vitals:  Vitals:   10/19/20 2257 10/20/20 0257  BP: 116/77 104/67  Pulse: 100 88  Resp: 18 16  Temp: 36.7 C 36.9 C  SpO2: 98% 95%    Last Pain:  Vitals:   10/20/20 0257  TempSrc: Oral  PainSc:                  Jeanine Luz

## 2020-10-20 NOTE — Lactation Note (Signed)
Lactation Consultation Note  Patient Name: Linda Mendez [id number] Date: 10/20/2020 Reason for consult: Initial assessment Age:25 y.o.   LC completed initial visit today, family dyad very welcoming and enjoying this moment. Mom and dad open to learning, so breastfeeding can be successful. Mom reports infants last feeing was around 10am for ,  LC also reviewed feeding sheet and notices most feeds have been around 10-50mins. Mom shared that most feedings have been unassisted, first two with assistance. Mom wanted to clarify, what a proper latch felt and looked liked and requested LC come back to observe next feeding. LC explained what it should feel like, pinch vs tug, infants head and neck position, lips and also nipple shape when infant comes off breast. LC asked about any nipple pain or damage, mom reported left nipple had a visible red blister. LC didn't see visually, mom said at next feeding it would be better to examine, LC encouraged expressing breast milk to help with healing.     LC will follow-up at next feeding to observe and assist as needed    Feeding Mother's Current Feeding Choice: Breast Milk      Consult Status Consult Status: Follow-up Date: 10/21/20 Follow-up type: In-patient    [name] 10/20/2020, 12:20 PM

## 2020-10-20 NOTE — Progress Notes (Signed)
Patient ID: Linda Mendez, female   DOB: Jun 09, 1996, 25 y.o.   MRN: [id number]  Post Partum Day # 1, s/p vacuum assisted vaginal birth, Rh positive, GBS negative, Breastfeeding  Subjective:  Patient sitting in bed. Reports doing well overall, feeling better after sleeping last night. Notes suck blister to center of left nipple.   Infant sleeping in bassinet at bedside. Husband in room for continuous postpartum support.   Denies difficulty breathing or respiratory distress, chest pain, abdominal pain, excessive vaginal bleeding, dysuria, and leg pain or swelling.   Objective: Temp:  [97.8 F (36.6 C)-100.8 F (38.2 C)] 98.2 F (36.8 C) (03/08 0740) Pulse Rate:  [69-111] 81 (03/08 0740) Resp:  [16-20] 19 (03/08 0740) BP: (97-133)/(59-82) 106/73 (03/08 0740) SpO2:  [91 %-100 %] 100 % (03/08 0740)  Physical Exam:   General: alert and cooperative   Lungs: clear to auscultation bilaterally  Breasts: normal appearance, no masses or tenderness except for suck blister to center of left nipple  Heart: normal apical impulse  Abdomen: soft, non-tender; bowel sounds normal; no masses,  no organomegaly  Pelvis: Lochia: appropriate, Uterine Fundus: firm  Extremities: DVT Evaluation: No evidence of DVT seen on physical exam.  Recent Labs    10/18/20 1856 10/20/20 0719  HGB 12.5 8.8*  HCT 37.9 26.2*    Assessment  25 year old G1P1, Post Partum Day # 1, s/p vacuum assisted vaginal birth, Rh positive, GBS negative  Breastfeeding  Plan:  Routine postpartum care and orders including breastfeeding support; lactation consultation today, see orders.   Rx Iron supplement and colace, see chart.   Reviewed red flag symptoms and when to call.   Continue orders as written. Reassess as needed.   Anticipate discharge tomorrow morning.    LOS: 2 days   Gunnar Bulla, CNM [facility] 10/20/2020 11:11 AM

## 2020-10-21 ENCOUNTER — Ambulatory Visit: Admission: RE | Admit: 2020-10-21 | Payer: BC Managed Care – PPO | Source: Ambulatory Visit

## 2020-10-21 MED ORDER — IBUPROFEN 600 MG PO TABS: 600.0000 mg | ORAL_TABLET | Freq: Four times a day (QID) | ORAL | 0 refills | Status: DC

## 2020-10-21 MED ORDER — BENZOCAINE-MENTHOL 20-0.5 % EX AERO: 1.0000 "application " | INHALATION_SPRAY | CUTANEOUS | 1 refills | Status: DC | PRN

## 2020-10-21 MED ORDER — FERROUS SULFATE 325 (65 FE) MG PO TABS: 325.0000 mg | ORAL_TABLET | Freq: Three times a day (TID) | ORAL | 3 refills | Status: DC

## 2020-10-21 MED ORDER — DOCUSATE SODIUM 100 MG PO CAPS: 100.0000 mg | ORAL_CAPSULE | Freq: Every day | ORAL | 0 refills | Status: DC

## 2020-10-21 MED ORDER — WITCH HAZEL-GLYCERIN EX PADS: 1.0000 "application " | MEDICATED_PAD | CUTANEOUS | 12 refills | Status: DC | PRN

## 2020-10-21 NOTE — Discharge Summary (Signed)
Discharge Summary  Patient ID: Linda Mendez MRN: [id number] DOB/AGE: [birth year] 25 y.o.   Date of Admission: 10/18/2020  Date of Discharge:  10/21/20  Admitting Diagnosis: Onset of Labor at [redacted]w[redacted]d  Secondary Diagnosis:   Patient Active Problem List   Diagnosis Date Noted  . Vacuum-assisted vaginal delivery 10/19/2020  . Second degree perineal laceration 10/19/2020  . Vaginal laceration 10/19/2020  . Encounter for induction of labor 10/18/2020  . COVID-19 virus infection 09/15/2020  . Pregnant 09/15/2020  . Tachycardia 08/27/2020  . Family history of gestational diabetes mellitus (GDM) in mother 03/20/2020  . Type A blood, Rh positive 03/20/2020  . Menstrual changes 02/05/2020  . Syncope 01/19/2020  . Hemorrhoids 05/19/2019  . Constipation 05/19/2019  . Menorrhagia with regular cycle 10/02/2017     Mode of Delivery: vacuum-assisted vaginal delivery     Discharge Diagnosis: No other diagnosis   Intrapartum Procedures: Atificial rupture of membranes, epidural and pitocin augmentation   Post partum procedures: none  Complications: Second degree perineal laceration and vaginal laceration, repaired   Brief Hospital Course   Linda Mendez is a G1P1001 who had a vacuum assisted vaginal birth on 10/19/2020;  for further details of this birth, please refer to the delivey summary.  Patient had an uncomplicated postpartum course.  By time of discharge on PPD#2, her pain was controlled on oral pain medications; she had appropriate lochia and was ambulating, voiding without difficulty and tolerating regular diet.  She was deemed stable for discharge to home.    Labs:  CBC Latest Ref Rng & Units 10/20/2020 10/18/2020 07/27/2020  WBC 4.0 - 10.5 K/uL 17.0(H) 11.0(H) 7.1  Hemoglobin 12.0 - 15.0 g/dL 1.9(J) 47.8 29.5  Hematocrit 36.0 - 46.0 % 26.2(L) 37.9 32.6(L)  Platelets 150 - 400 K/uL 192 223 217   A POS Performed at [facility], [address] [place], [place]  [address]   Physical exam:   Temp:  [97.8 F (36.6 C)-98.9 F (37.2 C)] 98 F (36.7 C) (03/09 0100) Pulse Rate:  [81-110] 110 (03/09 0100) Resp:  [19-20] 20 (03/09 0100) BP: (103-121)/(64-80) 121/80 (03/09 0100) SpO2:  [98 %-100 %] 98 % (03/09 0100)  General: alert and no distress  Lochia: appropriate  Abdomen: soft, NT  Uterine Fundus: firm  Perineum: healing well, no significant drainage, no dehiscence, no significant erythema  Extremities: No evidence of DVT seen on physical exam. No lower extremity edema.  Edinburgh Postnatal Depression Scale Screening Tool 10/20/2020  I have been able to laugh and see the funny side of things. (No Data)     Discharge Instructions: Per After Visit Summary.  Activity: Advance as tolerated. Pelvic rest for 6 weeks.  Also refer to  After Visit Summary  Diet: Regular  Medications: Allergies as of 10/21/2020      Reactions   Amoxicillin Hives   Penicillins Hives      Medication List    TAKE these medications   acetaminophen 500 MG tablet Commonly known as: TYLENOL Take 500 mg by mouth every 6 (six) hours as needed.   benzocaine-Menthol 20-0.5 % Aero Commonly known as: DERMOPLAST Apply 1 application topically as needed for irritation (perineal discomfort).   Breast Pump Misc Dispense one breast pump for patient   calcium carbonate 500 MG chewable tablet Commonly known as: TUMS - dosed in mg elemental calcium Chew 1 tablet by mouth daily.   docusate sodium 100 MG capsule Commonly known as: COLACE Take 1 capsule (100 mg total) by mouth daily.  ferrous sulfate 325 (65 FE) MG tablet Take 1 tablet (325 mg total) by mouth 3 (three) times daily with meals.   ibuprofen 600 MG tablet Commonly known as: ADVIL Take 1 tablet (600 mg total) by mouth every 6 (six) hours.   PRENATAL VITAMIN PO Take by mouth daily.   witch hazel-glycerin pad Commonly known as: TUCKS Apply 1 application topically as needed for hemorrhoids.             Discharge Care Instructions  (From admission, onward)         Start     Ordered   10/21/20 0000  Discharge wound care:       Comments: See AVS   10/21/20 0500         Outpatient follow up:   Follow-up Information    Gunnar Bulla, CNM Follow up.   Specialties: Certified Nurse Midwife, Obstetrics and Gynecology, Radiology Why: Someone from the office should contact you to schedule a two (2) week TELEVISIT and six (6) week postpartum visit Contact information: [address] [place] [address] [phone]              Postpartum contraception: condoms  Discharged Condition: stable  Discharged to: home   Newborn Data:  Disposition:home with mother  Apgars: APGAR (1 MIN): 7   APGAR (5 MINS): 9    Baby Feeding: Breast   [name], CNM  [facility] 10/21/20 5:00 AM

## 2020-10-21 NOTE — Discharge Instructions (Signed)
Discharge Instructions:   Follow-up Appointment: Someone from the office should contact you to schedule a two-week televisit and six-week postpartum visit!   If there are any new medications, they have been ordered and will be available for pickup at the listed pharmacy on your way home from the hospital.   Call office if you have any of the following: headache, visual changes, fever >101.0 F, chills, shortness of breath, breast concerns, excessive vaginal bleeding, incision drainage or problems, leg pain or redness, depression or any other concerns. If you have vaginal discharge with an odor, let your doctor know.   It is normal to bleed for up to 6 weeks. You should not soak through more than 1 pad in 1 hour. If you have a blood clot larger than your fist with continued bleeding, call your doctor.   Activity: Do not lift > 10 lbs for 6 weeks (do not lift anything heavier than your baby). No intercourse, tampons, swimming pools, hot tubs, baths (only showers) for 6 weeks.  No driving for 1-2 weeks. Continue prenatal vitamin, especially if breastfeeding. Increase calories and fluids (water) while breastfeeding.   Your milk will come in, in the next couple of days (right now it is colostrum). You may have a slight fever when your milk comes in, but it should go away on its own.  If it does not, and rises above 101 F please call the doctor. You will also feel achy and your breasts will be firm. They will also start to leak. If you are breastfeeding, continue as you have been and you can pump/express milk for comfort.   If you have too much milk, your breasts can become engorged, which could lead to mastitis. This is an infection of the milk ducts. It can be very painful and you will need to notify your doctor to obtain a prescription for antibiotics. You can also treat it with a shower or hot/cold compress.   For concerns about your baby, please call your pediatrician.  For breastfeeding concerns,  the lactation consultant can be reached at [phone].   Postpartum blues (feelings of happy one minute and sad another minute) are normal for the first few weeks but if it gets worse let your doctor know.   Congratulations! We enjoyed caring for you and your new bundle of joy!

## 2020-10-21 NOTE — Lactation Note (Signed)
This note was copied from a baby's chart. Lactation Consultation Note  Patient Name: Linda Mendez TIRWE'R Date: 10/21/2020 Reason for consult: Follow-up assessment;1st time breastfeeding;Term Age:26 hours  Lactation follow-up prior to anticipated discharge. Cluster feeding reported overnight, parents feel confident with how baby did. Mom no longer has blister on L nipple; using her own comfort gels from home.  LC reviewed BF basics and anticipation for upcoming days. Reviewed newborn stomach size, growth spurts/cluster feeding, early cues, signs of adequate intake/transfer and output expectations.   LC reviewed with mom breast changes- breast fullness and engorgement and management of both. Nipple care, pump introduction, use of hand pump for softening of breast prior to latching if needed, and position/alignment of baby.  Information for outpatient lactation services and community breastfeeding support. Encouraged to call with questions/concerns or for breastfeeding support.   Maternal Data Has patient been taught Hand Expression?: Yes Does the patient have breastfeeding experience prior to this delivery?: No  Feeding Mother's Current Feeding Choice: Breast Milk  LATCH Score                    Lactation Tools Discussed/Used Tools: Comfort gels  Interventions Interventions: Breast feeding basics reviewed;Education  Discharge Discharge Education: Engorgement and breast care;Warning signs for feeding baby;Outpatient recommendation  Consult Status Consult Status: Complete Date: 10/21/20 Follow-up type: Call as needed    Danford Bad 10/21/2020, 9:48 AM

## 2020-10-21 NOTE — Progress Notes (Signed)
Patient discharged home with infant. FOB present at discharge. Discharge instructions and prescriptions given and reviewed with patient. Patient verbalized understanding.   Explained OB office will call for follow-up appointments (2-week televisit and 6-week in person office visit).  Escorted out by volunteers.

## 2020-11-02 ENCOUNTER — Encounter: Payer: Self-pay | Admitting: Certified Nurse Midwife

## 2020-11-02 ENCOUNTER — Ambulatory Visit (INDEPENDENT_AMBULATORY_CARE_PROVIDER_SITE_OTHER): Payer: BC Managed Care – PPO | Admitting: Certified Nurse Midwife

## 2020-11-02 ENCOUNTER — Other Ambulatory Visit: Payer: Self-pay

## 2020-11-02 DIAGNOSIS — Z1331 Encounter for screening for depression: Secondary | ICD-10-CM

## 2020-11-02 NOTE — Patient Instructions (Signed)
Obstetrics and Gynecology, 128(1), e1-e15. [id number]  Care of a Perineal Tear The following information offers guidance about how to care for a perineal tear (laceration). Some women develop a perineal tear during a vaginal birth. This can happen as the baby emerges from the birth canal and the area between the vagina and the anus (perineum) is stretched, or a surgical cut is made (episiotomy). There are four degrees of perineal tears based on the depth and length of the laceration.  First degree. This involves a shallow tear at the edge of the vaginal opening that extends slightly into the perineal skin.  Second degree. This involves tearing described in first-degree perineal tear, and an additional deeper tear of the vaginal opening and perineal tissues. It may also include tearing of a muscle just under the perineal skin.  Third degree. This involves tearing described in first-degree and second-degree perineal tears. Tearing in the third degree extends into the muscle of the anus (anal sphincter).  Fourth degree. This involves tears described in all three degrees. Tearing in the fourth degree extends into the rectum. First-degree and second-degree perineal tears may or may not be stitched closed, depending on their location and appearance. Third-degree and fourth-degree perineal tears are stitched closed immediately after the baby's birth. What are the risks? Depending on the type of perineal tear you have, you may be at risk for:  Bleeding.  Developing a collection of blood in the perineal tear area (hematoma).  Pain when you urinate or have a bowel movement.  Infection at the site of the tear.  Fever.  Trouble controlling urination or bowel movements (incontinence).  Painful sex. How to care for a perineal tear Wound care  Take sitz baths as told by your health care provider to speed up healing. In a sitz bath, you sit down in warm water. A  sitz bath can be taken in one of two ways: ? Using an over-the-toilet sitz bath. ? Using a bathtub. The water should be deep enough to cover your hips and buttocks.  Wear a sanitary pad as told by your health care provider. Change the pad as often as told.  Leave stitches (sutures), skin glue, or adhesive tape in place. Do not remove adhesive tape unless your health care provider tells you to do so.  Check your wound every day for signs of infection. Check for: ? Redness, swelling, or pain. ? Fluid or blood. ? Warmth. ? Pus or a bad smell.      Managing pain  If directed, put ice on the affected area. To do this: ? Put ice in a plastic bag. ? Place a towel between your skin and the bag. ? Leave the ice on for 20 minutes, 2-3 times a day. ? Remove the ice if your skin turns bright red. This is very important. If you cannot feel pain, heat, or cold, you have a greater risk of damage to the area.  Apply a numbing spray to the perineal tear site as told by your health care provider. This may help with discomfort.  Take or apply over-the-counter and prescription medicines only as told by your health care provider.  If told, put about three witch hazel-containing hemorrhoid treatment pads on top of your sanitary pad. The witch hazel in the hemorrhoid pads helps with swelling and discomfort.  If comfortable, sit on an inflatable ring or pillow.   General instructions  Use a squirt bottle to squeeze warm water on your perineum after urinating to  clean the area. This is instead of wiping and should be done from front to back. Pat the area gently to dry it.  Do not have sex, use tampons, or place anything in your vagina for at least 6 weeks or as told by your health care provider.  Keep all follow-up visits, including postpartum visits. This is important. Contact a health care provider if:  Your pain is not relieved with medicines.  You have painful urination.  You have redness,  swelling, or pain around your tear.  You have fluid or blood coming from your tear.  Your tear feels warm to the touch.  You have pus or a bad smell coming from your tear.  You have a fever. Get help right away if:  Your tear opens.  You cannot urinate.  You have an increase in bleeding.  You have severe pain. Summary  A perineal tear is a tear (laceration) in the tissue between the opening of the vagina and the anus (perineum).  There are four degrees of perineal tears based on how deep and long the laceration is.  First-degree and second-degree perineal tears may or may not be stitched closed, depending on their location and appearance. Third-degree and fourth-degree perineal tears are stitched closed immediately after the baby's birth.  Get help right away if your tear opens, you cannot urinate, you have an increase in bleeding, or you have severe pain. This information is not intended to replace advice given to you by your health care provider. Make sure you discuss any questions you have with your health care provider. Document Revised: 04/16/2020 Document Reviewed: 04/16/2020 Elsevier Patient Education  2021 [employer].   Postpartum Care After Vaginal Delivery The following information offers guidance about how to care for yourself from the time you deliver your baby to 6-12 weeks after delivery (postpartum period). If you have problems or questions, contact your health care provider for more specific instructions. Follow these instructions at home: Vaginal bleeding  It is normal to have vaginal bleeding (lochia) after delivery. Wear a sanitary pad for bleeding and discharge. ? During the first week after delivery, the amount and appearance of lochia is often similar to a menstrual period. ? Over the next few weeks, it will gradually decrease to a dry, yellow-brown discharge. ? For most women, lochia stops completely by 4-6 weeks after delivery, but can  vary.  Change your sanitary pads frequently. Watch for any changes in your flow, such as: ? A sudden increase in volume. ? A change in color. ? Large blood clots.  If you pass a blood clot from your vagina, save it and call your health care provider. Do not flush blood clots down the toilet before talking with your health care provider.  Do not use tampons or douches until your health care provider approves.  If you are not breastfeeding, your period should return 6-8 weeks after delivery. If you are feeding your baby breast milk only, your period may not return until you stop breastfeeding. Perineal care  Keep the area between the vagina and the anus (perineum) clean and dry. Use medicated pads and pain-relieving sprays and creams as directed.  If you had a surgical cut in the perineum (episiotomy) or a tear, check the area for signs of infection until you are healed. Check for: ? More redness, swelling, or pain. ? Fluid or blood coming from the cut or tear. ? Warmth. ? Pus or a bad smell.  You may be given  a squirt bottle to use instead of wiping to clean the perineum area after you use the bathroom. Pat the area gently to dry it.  To relieve pain caused by an episiotomy, a tear, or swollen veins in the anus (hemorrhoids), take a warm sitz bath 2-3 times a day. In a sitz bath, the warm water should only come up to your hips and cover your buttocks.   Breast care  In the first few days after delivery, your breasts may feel heavy, full, and uncomfortable (breast engorgement). Milk may also leak from your breasts. Ask your health care provider about ways to help relieve the discomfort.  If you are breastfeeding: ? Wear a bra that supports your breasts and fits well. Use breast pads to absorb milk that leaks. ? Keep your nipples clean and dry. Apply creams and ointments as told. ? You may have uterine contractions every time you breastfeed for up to several weeks after delivery. This  helps your uterus return to its normal size. ? If you have any problems with breastfeeding, notify your health care provider or lactation consultant.  If you are not breastfeeding: ? Avoid touching your breasts. Do not squeeze out (express) milk. Doing this can make your breasts produce more milk. ? Wear a good-fitting bra and use cold packs to help with swelling. Intimacy and sexuality  Ask your health care provider when you can engage in sexual activity. This may depend upon: ? Your risk of infection. ? How fast you are healing. ? Your comfort and desire to engage in sexual activity.  You are able to get pregnant after delivery, even if you have not had your period. Talk with your health care provider about methods of birth control (contraception) or family planning if you desire future pregnancies. Medicines  Take over-the-counter and prescription medicines only as told by your health care provider.  Take an over-the-counter stool softener to help ease bowel movements as told by your health care provider.  If you were prescribed an antibiotic medicine, take it as told by your health care provider. Do not stop taking the antibiotic even if you start to feel better.  Review all previous and current prescriptions to check for possible transfer into breast milk. Activity  Gradually return to your normal activities as told by your health care provider.  Rest as much as possible. Nap while your baby is sleeping. Eating and drinking  Drink enough fluid to keep your urine pale yellow.  To help prevent or relieve constipation, eat high-fiber foods every day.  Choose healthy eating to support breastfeeding or weight loss goals.  Take your prenatal vitamins until your health care provider tells you to stop.   General tips/recommendations  Do not use any products that contain nicotine or tobacco. These products include cigarettes, chewing tobacco, and vaping devices, such as  e-cigarettes. If you need help quitting, ask your health care provider.  Do not drink alcohol, especially if you are breastfeeding.  Do not take medications or drugs that are not prescribed to you, especially if you are breastfeeding.  Visit your health care provider for a postpartum checkup within the first 3-6 weeks after delivery.  Complete a comprehensive postpartum visit no later than 12 weeks after delivery.  Keep all follow-up visits for you and your baby. Contact a health care provider if:  You feel unusually sad or worried.  Your breasts become red, painful, or hard.  You have a fever or other signs of an infection.  You have bleeding that is soaking through one pad an hour or you have blood clots.  You have a severe headache that doesn't go away or you have vision changes.  You have nausea and vomiting and are unable to eat or drink anything for 24 hours. Get help right away if:  You have chest pain or difficulty breathing.  You have sudden, severe leg pain.  You faint or have a seizure.  You have thoughts about hurting yourself or your baby. If you ever feel like you may hurt yourself or others, or have thoughts about taking your own life, get help right away. Go to your nearest emergency department or:  Call your local emergency services (911 in the [place]  The National Suicide Prevention Lifeline at 870-659-7009. This suicide crisis helpline is open 24 hours a day.  Text the Crisis Text Line at 615-226-2236 (in the [place] Summary  The period of time after you deliver your newborn up to 6-12 weeks after delivery is called the postpartum period.  Keep all follow-up visits for you and your baby.  Review all previous and current prescriptions to check for possible transfer into breast milk.  Contact a health care provider if you feel unusually sad or worried during the postpartum period. This information is not intended to replace advice given to you by your  health care provider. Make sure you discuss any questions you have with your health care provider. Document Revised: 04/16/2020 Document Reviewed: 04/16/2020 Elsevier Patient Education  2021 Elsevier Inc.    Postpartum Baby Blues The postpartum period begins right after the birth of a baby. During this time, there is often joy and excitement. It is also a time of many changes in the life of the parents. A mother may feel happy one minute and sad or stressed the next. These feelings of sadness, called the baby blues, usually happen in the period right after the baby is born and go away within a week or two. What are the causes? The exact cause of this condition is not known. Changes in hormone levels after childbirth are believed to trigger some of the symptoms. Other factors that can play a role in these mood changes include:  Lack of sleep.  Stressful life events, such as financial problems, caring for a loved one, or death of a loved one.  Genetics. What are the signs or symptoms? Symptoms of this condition include:  Changes in mood, such as going from extreme happiness to sadness.  A decrease in concentration.  Difficulty sleeping.  Crying spells and tearfulness.  Loss of appetite.  Irritability.  Anxiety. If these symptoms last for more than 2 weeks or become more severe, you may have postpartum depression. How is this diagnosed? This condition is diagnosed based on an evaluation of your symptoms. Your health care provider may use a screening tool that includes a list of questions to help identify a person with the baby blues or postpartum depression. How is this treated? The baby blues usually go away on their own in 1-2 weeks. Social support is often what is needed. You will be encouraged to get adequate sleep and rest. Follow these instructions at home: Lifestyle  Get as much rest as you can. Take a nap when the baby sleeps.  Exercise regularly as told by your  health care provider. Some women find yoga and walking to be helpful.  Eat a balanced and nourishing diet. This includes plenty of fruits and vegetables, whole grains, and  proteins.  Do little things that you enjoy. Take a bubble bath, read your favorite magazine, or listen to your favorite music.  Avoid alcohol.  Ask for help with household chores, cooking, grocery shopping, or running errands. Do not try to do everything yourself. Consider hiring a postpartum doula to help. This is a professional who specializes in providing support to new mothers.  Try not to make any major life changes during pregnancy or right after giving birth. This can add stress.      General instructions  Talk to people close to you about how you are feeling. Get support from your partner, family members, friends, or other new moms. You may want to join a support group.  Find ways to manage stress. This may include: ? Writing your thoughts and feelings in a journal. ? Spending time outside. ? Spending time with people who make you laugh.  Try to stay positive in how you think. Think about the things you are grateful for.  Take over-the-counter and prescription medicines only as told by your health care provider.  Let your health care provider know if you have any concerns.  Keep all postpartum visits. This is important. Contact a health care provider if:  Your baby blues do not go away after 2 weeks. Get help right away if:  You have thoughts of taking your own life (suicidal thoughts), or of harming your baby or someone else.  You see or hear things that are not there (hallucinations). If you ever feel like you may hurt yourself or others, or have thoughts about taking your own life, get help right away. Go to your nearest emergency department or:  Call your local emergency services (911 in the [place]  Call a suicide crisis helpline, such as the National Suicide Prevention Lifeline, at  1-800-273-8255. This is open 24 hours a day in the [place]  Text the Crisis Text Line at 741741 (in the [place] Summary  After giving birth, you may feel happy one minute and sad or stressed the next. Feelings of sadness that happen right after the baby is born and go away after a week or two are called the baby blues.  You can manage the baby blues by getting enough rest, eating a healthy diet, exercising, spending time with supportive people, and finding ways to manage stress.  If feelings of sadness and stress last longer than 2 weeks or get in the way of caring for your baby, talk with your health care provider. This may mean you have postpartum depression. This information is not intended to replace advice given to you by your health care provider. Make sure you discuss any questions you have with your health care provider. Document Revised: 01/24/2020 Document Reviewed: 01/24/2020 Elsevier Patient Education  2021 Elsevier Inc.  

## 2020-11-02 NOTE — Progress Notes (Signed)
Virtual Visit via Video Note  I connected with Linda Mendez on 11/02/20 at  1:45 PM EDT by a video enabled telemedicine application and verified that I am speaking with the correct person using two identifiers.  Location:  Patient: Linda Mendez (home)  Provider: [name], CNM ([facility], [facility]   I discussed the limitations of evaluation and management by telemedicine and the availability of in person appointments. The patient expressed understanding and agreed to proceed.  History of Present Illness:  Patient is two (2) weeks postpartum after vacuum assisted vaginal birth of female infant.   Breastfeeding without difficulty. Nipples are intact.   Reports light red to pink vaginal bleeding.   Sleeping better than anticipated. Has good support at home.   Denies difficulty breathing or respiratory distress, chest pain, abdominal pain, excessive vaginal bleeding, dysuria, and leg pain.     Observations/Objective:  Depression screen [facility] 2/9 11/02/2020 09/29/2020 09/29/2020 05/17/2019 10/04/2017  Decreased Interest 1 0 0 0 0  Down, Depressed, Hopeless 0 0 0 0 0  PHQ - 2 Score 1 0 0 0 0  Altered sleeping 0 0 - - 1  Tired, decreased energy 2 2 - - 1  Change in appetite 0 1 - - 0  Feeling bad or failure about yourself  0 0 - - 0  Trouble concentrating 0 1 - - 0  Moving slowly or fidgety/restless 0 0 - - 0  Suicidal thoughts 0 0 - - 0  PHQ-9 Score 3 4 - - 2  Difficult doing work/chores Somewhat difficult Somewhat difficult - - Not difficult at all   GAD 7 : Generalized Anxiety Score 11/02/2020 09/29/2020  Nervous, Anxious, on Edge 1 1  Control/stop worrying 1 0  Worry too much - different things 1 1  Trouble relaxing 0 1  Restless 0 1  Easily annoyed or irritable 2 0  Afraid - awful might happen 2 0  Total GAD 7 Score 7 4  Anxiety Difficulty Somewhat difficult Somewhat difficult     Assessment:  Postpartum care and examination  Lactating  mother  Depression screening negative  Plan:  Routine postpartum education.   Reviewed red flag symptoms and when to call.   RTC x 4-5 weeks for PPV or sooner if needed.    Follow Up Instructions:    I discussed the assessment and treatment plan with the patient. The patient was provided an opportunity to ask questions and all were answered. The patient agreed with the plan and demonstrated an understanding of the instructions.   The patient was advised to call back or seek an in-person evaluation if the symptoms worsen or if the condition fails to improve as anticipated.  I provided 6 minutes of non-face-to-face time during this encounter.   [name], CNM [facility], [facility] 11/02/20 1:51 PM

## 2020-11-26 ENCOUNTER — Other Ambulatory Visit: Payer: Self-pay | Admitting: Certified Nurse Midwife

## 2020-11-26 NOTE — Progress Notes (Signed)
Referral to Pelvic Floor Physical Therapy, see orders.    [name], CNM [facility], [facility] 11/26/20 2:47 PM

## 2020-12-07 ENCOUNTER — Ambulatory Visit (INDEPENDENT_AMBULATORY_CARE_PROVIDER_SITE_OTHER): Payer: BC Managed Care – PPO | Admitting: Certified Nurse Midwife

## 2020-12-07 ENCOUNTER — Other Ambulatory Visit: Payer: Self-pay

## 2020-12-07 ENCOUNTER — Encounter: Payer: Self-pay | Admitting: Certified Nurse Midwife

## 2020-12-07 DIAGNOSIS — Z1331 Encounter for screening for depression: Secondary | ICD-10-CM

## 2020-12-07 DIAGNOSIS — M6208 Separation of muscle (nontraumatic), other site: Secondary | ICD-10-CM

## 2020-12-07 NOTE — Patient Instructions (Addendum)
Obstetrics and Gynecology, 128(1), e1-e15. [id number]  Care of a Perineal Tear The following information offers guidance about how to care for a perineal tear (laceration). Some women develop a perineal tear during a vaginal birth. This can happen as the baby emerges from the birth canal and the area between the vagina and the anus (perineum) is stretched, or a surgical cut is made (episiotomy). There are four degrees of perineal tears based on the depth and length of the laceration.  First degree. This involves a shallow tear at the edge of the vaginal opening that extends slightly into the perineal skin.  Second degree. This involves tearing described in first-degree perineal tear, and an additional deeper tear of the vaginal opening and perineal tissues. It may also include tearing of a muscle just under the perineal skin.  Third degree. This involves tearing described in first-degree and second-degree perineal tears. Tearing in the third degree extends into the muscle of the anus (anal sphincter).  Fourth degree. This involves tears described in all three degrees. Tearing in the fourth degree extends into the rectum. First-degree and second-degree perineal tears may or may not be stitched closed, depending on their location and appearance. Third-degree and fourth-degree perineal tears are stitched closed immediately after the baby's birth. What are the risks? Depending on the type of perineal tear you have, you may be at risk for:  Bleeding.  Developing a collection of blood in the perineal tear area (hematoma).  Pain when you urinate or have a bowel movement.  Infection at the site of the tear.  Fever.  Trouble controlling urination or bowel movements (incontinence).  Painful sex. How to care for a perineal tear Wound care  Take sitz baths as told by your health care provider to speed up healing. In a sitz bath, you sit down in warm water. A  sitz bath can be taken in one of two ways: ? Using an over-the-toilet sitz bath. ? Using a bathtub. The water should be deep enough to cover your hips and buttocks.  Wear a sanitary pad as told by your health care provider. Change the pad as often as told.  Leave stitches (sutures), skin glue, or adhesive tape in place. Do not remove adhesive tape unless your health care provider tells you to do so.  Check your wound every day for signs of infection. Check for: ? Redness, swelling, or pain. ? Fluid or blood. ? Warmth. ? Pus or a bad smell.      Managing pain  If directed, put ice on the affected area. To do this: ? Put ice in a plastic bag. ? Place a towel between your skin and the bag. ? Leave the ice on for 20 minutes, 2-3 times a day. ? Remove the ice if your skin turns bright red. This is very important. If you cannot feel pain, heat, or cold, you have a greater risk of damage to the area.  Apply a numbing spray to the perineal tear site as told by your health care provider. This may help with discomfort.  Take or apply over-the-counter and prescription medicines only as told by your health care provider.  If told, put about three witch hazel-containing hemorrhoid treatment pads on top of your sanitary pad. The witch hazel in the hemorrhoid pads helps with swelling and discomfort.  If comfortable, sit on an inflatable ring or pillow.   General instructions  Use a squirt bottle to squeeze warm water on your perineum after urinating to  clean the area. This is instead of wiping and should be done from front to back. Pat the area gently to dry it.  Do not have sex, use tampons, or place anything in your vagina for at least 6 weeks or as told by your health care provider.  Keep all follow-up visits, including postpartum visits. This is important. Contact a health care provider if:  Your pain is not relieved with medicines.  You have painful urination.  You have redness,  swelling, or pain around your tear.  You have fluid or blood coming from your tear.  Your tear feels warm to the touch.  You have pus or a bad smell coming from your tear.  You have a fever. Get help right away if:  Your tear opens.  You cannot urinate.  You have an increase in bleeding.  You have severe pain. Summary  A perineal tear is a tear (laceration) in the tissue between the opening of the vagina and the anus (perineum).  There are four degrees of perineal tears based on how deep and long the laceration is.  First-degree and second-degree perineal tears may or may not be stitched closed, depending on their location and appearance. Third-degree and fourth-degree perineal tears are stitched closed immediately after the baby's birth.  Get help right away if your tear opens, you cannot urinate, you have an increase in bleeding, or you have severe pain. This information is not intended to replace advice given to you by your health care provider. Make sure you discuss any questions you have with your health care provider. Document Revised: 04/16/2020 Document Reviewed: 04/16/2020 Elsevier Patient Education  2021 [employer] [place] [place]  Diastasis recti is a condition in which the muscles of the abdomen (rectus abdominis muscles) become thin and separate. The result is a wider space between the muscles of the right and left abdomen (abdominal muscles). This wider space between the muscles may cause a bulge in the middle of the abdomen. This bulge may be noticed when a person is straining or when he or she sits up after lying down. Diastasis recti can affect men and women. It is most common among pregnant women, babies, people with obesity, and people who have had abdominal surgery. Exercise or surgery may help correct this condition. What are the causes? Common causes of this condition include:  Pregnancy. As the uterus grows in size, it puts pressure on the abdominal  muscles, causing the muscles to separate.  Obesity. Excess fat puts pressure on abdominal muscles.  Weight lifting.  Some exercises of the abdomen.  Advanced age.  Genetics.  Having had surgery on the abdomen before. What increases the risk? This condition is more likely to develop in:  Women.  Newborns, especially newborns who are born early (prematurely). What are the signs or symptoms? Common symptoms of this condition include:  A bulge in the middle of your abdomen. You will notice it most when you sit up or strain.  Pain in your low back, hips, or the area between your hip bones (pelvis).  Constipation.  Being unable to control when you urinate (urinary incontinence).  Bloating.  Poor posture. How is this diagnosed? This condition is diagnosed with a physical exam. During the exam, your health care provider will ask you to lie flat on your back and do a crunch or half sit-up. If you have diastasis recti, a bulge will appear lengthwise between your abdominal muscles in the center of your abdomen.  Your health care provider will measure the gap between your muscles with one of the following:  A medical device used to measure the space between two objects (caliper).  A tape measure.  CT scan.  Ultrasound.  Finger spaces. Your health care provider will measure the space using his or her fingers. How is this treated? If your muscle separation is not too large, you may not need treatment. However, if you are a woman who plans to become pregnant again, you should treat this condition before your next pregnancy. Treatment may include:  Physical therapy exercises to strengthen and tighten your abdominal muscles.  Lifestyle changes such as weight loss and exercise.  Over-the-counter pain medicines as needed.  Surgery to correct the separation. Follow these instructions at home: Activity  Return to your normal activities as told by your health care provider. Ask  your health care provider what activities are safe for you.  Do exercises as told by your health care provider. Make sure you are doing your exercises and movements correctly when lifting weights or doing exercises using your abdominal muscles or the muscles in the center of your body that give stability (core muscles). Proper form can help to prevent this condition from happening again. General instructions  If you are overweight, ask your health care provider for help with weight loss. Losing even a small amount of weight can help to improve your diastasis recti.  Take over-the-counter or prescription medicines only as told by your health care provider.  Do not strain. Straining can make the separation worse. Examples of straining include: ? Pushing hard to have a bowel movement, such as when you have constipation. ? Lifting heavy objects or lifting children. ? Standing up and sitting down.  You may need to take these actions to prevent or treat constipation: ? Drink enough fluid to keep your urine pale yellow. ? Take over-the-counter or prescription medicines. ? Eat foods that are high in fiber, such as beans, whole grains, and fresh fruits and vegetables. ? Limit foods that are high in fat and processed sugars, such as fried or sweet foods.  Keep all follow-up visits. This is important. Contact a health care provider if:  You notice a new bulge in your abdomen. Get help right away if:  You experience severe discomfort in your abdomen.  You develop severe abdominal pain along with nausea, vomiting, or a fever. Summary  Diastasis recti is a condition in which the muscles of the abdomen (rectus abdominismuscles) become thin and separate. You may notice a bulge in your abdomen because the space has widened between the muscles of the right and left abdomen.  The most common symptom is a bulge in the middle of your abdomen. You will notice it most when you sit up or strain.  This  condition is diagnosed with a physical exam.  If the muscle separation is not too big, you may not need treatment. Otherwise, you may need to do physical therapy or have surgery. This information is not intended to replace advice given to you by your health care provider. Make sure you discuss any questions you have with your health care provider. Document Revised: 04/03/2020 Document Reviewed: 04/03/2020 Elsevier Patient Education  [place] 39-33 Years Old, Female Preventive care refers to lifestyle choices and visits with your health care provider that can promote health and wellness. This includes:  A yearly physical exam. This is also called an annual wellness visit.  Regular  dental and eye exams.  Immunizations.  Screening for certain conditions.  Healthy lifestyle choices, such as: ? Eating a healthy diet. ? Getting regular exercise. ? Not using drugs or products that contain nicotine and tobacco. ? Limiting alcohol use. What can I expect for my preventive care visit? Physical exam Your health care provider may check your:  Height and weight. These may be used to calculate your BMI (body mass index). BMI is a measurement that tells if you are at a healthy weight.  Heart rate and blood pressure.  Body temperature.  Skin for abnormal spots. Counseling Your health care provider may ask you questions about your:  Past medical problems.  Family's medical history.  Alcohol, tobacco, and drug use.  Emotional well-being.  Home life and relationship well-being.  Sexual activity.  Diet, exercise, and sleep habits.  Work and work Statistician.  Access to firearms.  Method of birth control.  Menstrual cycle.  Pregnancy history. What immunizations do I need? Vaccines are usually given at various ages, according to a schedule. Your health care provider will recommend vaccines for you based on your age, medical history, and lifestyle or  other factors, such as travel or where you work.   What tests do I need? Blood tests  Lipid and cholesterol levels. These may be checked every 5 years starting at age 47.  Hepatitis C test.  Hepatitis B test. Screening  Diabetes screening. This is done by checking your blood sugar (glucose) after you have not eaten for a while (fasting).  STD (sexually transmitted disease) testing, if you are at risk.  BRCA-related cancer screening. This may be done if you have a family history of breast, ovarian, tubal, or peritoneal cancers.  Pelvic exam and Pap test. This may be done every 3 years starting at age 37. Starting at age 103, this may be done every 5 years if you have a Pap test in combination with an HPV test. Talk with your health care provider about your test results, treatment options, and if necessary, the need for more tests.   Follow these instructions at home: Eating and drinking  Eat a healthy diet that includes fresh fruits and vegetables, whole grains, lean protein, and low-fat dairy products.  Take vitamin and mineral supplements as recommended by your health care provider.  Do not drink alcohol if: ? Your health care provider tells you not to drink. ? You are pregnant, may be pregnant, or are planning to become pregnant.  If you drink alcohol: ? Limit how much you have to 0-1 drink a day. ? Be aware of how much alcohol is in your drink. In the [place] one drink equals one 12 oz bottle of beer (355 mL), one 5 oz glass of wine (148 mL), or one 1 oz glass of hard liquor (44 mL).   Lifestyle  Take daily care of your teeth and gums. Brush your teeth every morning and night with fluoride toothpaste. Floss one time each day.  Stay active. Exercise for at least 30 minutes 5 or more days each week.  Do not use any products that contain nicotine or tobacco, such as cigarettes, e-cigarettes, and chewing tobacco. If you need help quitting, ask your health care provider.  Do not  use drugs.  If you are sexually active, practice safe sex. Use a condom or other form of protection to prevent STIs (sexually transmitted infections).  If you do not wish to become pregnant, use a form of birth control. If  you plan to become pregnant, see your health care provider for a prepregnancy visit.  Find healthy ways to cope with stress, such as: ? Meditation, yoga, or listening to music. ? Journaling. ? Talking to a trusted person. ? Spending time with friends and family. Safety  Always wear your seat belt while driving or riding in a vehicle.  Do not drive: ? If you have been drinking alcohol. Do not ride with someone who has been drinking. ? When you are tired or distracted. ? While texting.  Wear a helmet and other protective equipment during sports activities.  If you have firearms in your house, make sure you follow all gun safety procedures.  Seek help if you have been physically or sexually abused. What's next?  Go to your health care provider once a year for an annual wellness visit.  Ask your health care provider how often you should have your eyes and teeth checked.  Stay up to date on all vaccines. This information is not intended to replace advice given to you by your health care provider. Make sure you discuss any questions you have with your health care provider. Document Revised: 03/29/2020 Document Reviewed: 04/12/2018 Elsevier Patient Education  2021 [employer].

## 2020-12-07 NOTE — Progress Notes (Signed)
Subjective:    Linda Mendez is a 25 y.o. G44P1001 Caucasian female who presents for a postpartum visit. She is 6 weeks postpartum following a spontaneous vaginal delivery at 41+3 gestational weeks. Anesthesia: epidural. I have fully reviewed the prenatal and intrapartum course.   Postpartum course has been complicated by diastsis recti, pelvic pain with walking and "drooping" at vaginal opening.   Baby's course has been uncomplicated.   Baby is feeding by breast.   Menses resumed today.   Bowel function is normal except for hemorrhoids.   Bladder function is normal.   Patient is not sexually active. 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  Postpartum depression screening: negative.   Last pap 09/2017 and was negative or normal.  Denies difficulty breathing or respiratory distress, chest pain, abdominal pain, excessive vaginal bleeding, dysuria, and leg pain or swelling.   The following portions of the patient's history were reviewed and updated as appropriate: allergies, current medications, past medical history, past surgical history and problem list.  Review of Systems  Pertinent items are noted in HPI.   Objective:   BP 105/71   Pulse (!) 106   Resp 16   Ht 5\' 3"  (1.6 m)   Wt 124 lb 6.4 oz (56.4 kg)   LMP 12/07/2020 (Exact Date)   Breastfeeding Yes   BMI 22.04 kg/m   General:  alert, cooperative and no distress   Breasts:  deferred, no complaints  Lungs: clear to auscultation bilaterally  Heart:  regular rate and rhythm  Abdomen: soft, nontender; postpartum diastasis recti noted   Vulva: normal  Vagina: normal vagina, continuing to heal  Cervix:  closed  Corpus: Well-involuted  Adnexa:  Non-palpable  Rectal Exam:  External hemorrhoids present        Depression screen [facility] 2/9 12/07/2020 11/02/2020 09/29/2020 09/29/2020 05/17/2019  Decreased Interest 0 1 0 0 0  Down, Depressed, Hopeless 0 0 0 0 0  PHQ - 2 Score 0 1 0 0 0  Altered sleeping 0 0 0 - -   Tired, decreased energy 2 2 2  - -  Change in appetite 1 0 1 - -  Feeling bad or failure about yourself  0 0 0 - -  Trouble concentrating 0 0 1 - -  Moving slowly or fidgety/restless 0 0 0 - -  Suicidal thoughts 0 0 0 - -  PHQ-9 Score 3 3 4  - -  Difficult doing work/chores Not difficult at all Somewhat difficult Somewhat difficult - -   GAD 7 : Generalized Anxiety Score 12/07/2020 11/02/2020 09/29/2020  Nervous, Anxious, on Edge 0 1 1  Control/stop worrying 0 1 0  Worry too much - different things 0 1 1  Trouble relaxing 0 0 1  Restless 0 0 1  Easily annoyed or irritable 0 2 0  Afraid - awful might happen 0 2 0  Total GAD 7 Score 0 7 4  Anxiety Difficulty Not difficult at all Somewhat difficult Somewhat difficult     Assessment:   Postpartum exam Six (6) wks s/p spontaneous vaginal birth Breastfeeding Depression screening Contraception counseling   Plan:   Samples of premarin cream given to apply to vaginal opening nightly.   Discussed return activity in the postpartum period and limiting self in the setting of pain or discomfort.   Schedule to start pelvic floor physical therapy next month, see chart.   Reviewed red flag symptoms and when to call.   Follow up in: 4 months for ANNUAL EXAM and PAP  or earlier if needed.   [name], CNM [facility], [facility] 12/07/20 6:45 PM

## 2020-12-08 DIAGNOSIS — M6208 Separation of muscle (nontraumatic), other site: Secondary | ICD-10-CM | POA: Insufficient documentation

## 2020-12-24 ENCOUNTER — Encounter: Payer: Self-pay | Admitting: Physical Therapy

## 2020-12-24 ENCOUNTER — Ambulatory Visit: Payer: BC Managed Care – PPO | Attending: Certified Nurse Midwife | Admitting: Physical Therapy

## 2020-12-24 ENCOUNTER — Other Ambulatory Visit: Payer: Self-pay

## 2020-12-24 DIAGNOSIS — R2689 Other abnormalities of gait and mobility: Secondary | ICD-10-CM | POA: Diagnosis present

## 2020-12-24 DIAGNOSIS — M533 Sacrococcygeal disorders, not elsewhere classified: Secondary | ICD-10-CM | POA: Insufficient documentation

## 2020-12-24 DIAGNOSIS — M6208 Separation of muscle (nontraumatic), other site: Secondary | ICD-10-CM

## 2020-12-24 NOTE — Patient Instructions (Signed)
  Lengthen Back rib by L  shoulder    Lie on R  side , pillow between knees and under head  Pull L  arm overhead over mattress, grab the edge of mattress,pull it upward, drawing elbow away from ears  Breathing 10 reps  Open book  Lying on  R side , rotating  L  only this week  Rotating onto pillow /yoga block  Pillow/ Block between knees  10 reps  __  Sitting posture with feet on ground   ___  Avoid straining pelvic floor, abdominal muscles , spine  Use log rolling technique instead of getting out of bed with your neck or the sit-up     Log rolling into and out of bed   Log rolling into and out of bed If getting out of bed on R side, Bent knees, scoot hips/ shoulder to L  Raise R arm completely overhead, rolling onto armpit  Then lower bent knees to bed to get into complete side lying position  Then drop legs off bed, and push up onto R elbow/forearm, and use L hand to push onto the bed   __   Proper body mechanics with getting out of a chair to decrease strain  on back &pelvic floor   Avoid holding your breath when Getting out of the chair:  Scoot to front part of chair chair Heels behind knees, feet are hip width apart, nose over toes  Inhale like you are smelling roses Exhale to stand     __  Lifting mechanics   Use feet for stability, semi tandem stance, avoid bending forward  Exhale as you lift    Place baby into carseat that is sitting on a secure chair behind you before getting out of the chair. This will give you a lighter load (less strain on pelvic floor)  when you rise against gravity from the chair.  ____________  Floor to rise with baby     Baby is parallel to you or under arms, kneeling over toes, untucked Scoop baby under forearms as you squeeze shoulder blades back and down to bring baby to you   Tuck toes under, move knees and body towards ottoman/bench Place baby with your body as close to ottoman as possible  Transition to wide  minisquat  Exhale to life baby up

## 2020-12-25 NOTE — Therapy (Signed)
[facility] MAIN [facility] SERVICES [address] [place], [place], [address] Phone: [phone]   Fax:  [phone]  Physical Therapy Evaluation  Patient Details  Name: Linda Mendez MRN: [id number] Date of Birth: 03-01-1996 Referring Provider (PT): Jeralyn Bennett   Encounter Date: 12/24/2020   PT End of Session - 12/24/20 1617    Visit Number 1    Number of Visits 10    Date for PT Re-Evaluation 03/04/21    PT Start Time 1610    PT Stop Time 1710    PT Time Calculation (min) 60 min           Past Medical History:  Diagnosis Date  . Arm fracture, left   . Hemorrhoids   . Mononucleosis     Past Surgical History:  Procedure Laterality Date  . arm surgery    . blocked tear duct surgery    . FRACTURE SURGERY Left 2003   Left arm     There were no vitals filed for this visit.    Subjective Assessment - 12/24/20 1615    Subjective 1) pressure prolapse sensations:  Pt sought out PT because she is afraid of prolapse after her vacuum assisted delivery 9 weeks ago. Pt feels like there is a cotton ball in her vagina area especially when carrying dtr and having to get up from the floor or from sitting and also with coughing/ sneezing.  Pt has modifed breast feeding her in cross cross apple sauce position in her chair to push off her feet and elbow to avoid the lowered feeling. The prolapse feeling has gone away and has come back. Pt is 9 weeks post partum with her first child. Pt was in labor for almost 12 hours which included pushing and the vaccum assisted delivery and 2-3 degree tear. Baby was 8 lb 6 oz. Pt has bad back labor 5 days prior but not before the contractions started. Pt did gymnastics when she was younger and have landed on her bottom but not any signficant injuries. Denied any injuries prior to pregnancy.    2) pt feels pain in her LBP upper lumbar spine from carrying her baby.  2/10 without radiating pain.    3) pain with bowel movements  6-7/10 with bleeding due hemorrhoids..  prior to pregnancy , pt had bowel movements daily to every other day. Pt has Hx of hemorrhoids and uses squaty potty. During pregnancy, pt experienced grdual constipation, worsening as trimester progressed. Daily fluid inake: 20-30 fl of water, no sodas,. teas, coffee.  Denied SUI, leakage.    4) pain during sexual intercourse, vaginal OB/ GYN exams     5) pt also noticed foot arches falling in during pregnancy and would like to learn what to do about them      Patient Stated Goals Continue with postpartum workout " Expecting and Empowered" workout, decrease pain with bowel movements, improve separation in abdomen              [facility] PT Assessment - 12/25/20 1132      Assessment   Medical Diagnosis vacuum assisted delivery    Referring Provider (PT) Lawhorn      Precautions   Precautions None      Restrictions   Weight Bearing Restrictions No      Balance Screen   Has the patient fallen in the past 6 months No      Prior Function   Level of Independence Independent  AROM   Overall AROM Comments LBP L side flexion      Strength   Overall Strength Comments R knee flexion 4-/5, L 5/5, all other BLE test 4+/5 , no trunk perturbation, L hip abd 5/5, R 4-/5      Palpation   Spinal mobility R shoulder slightly higher, L patella base higher, R iliac crest higher, tenderness at L1, deviated to R    SI assessment  ASIS, patella, malleoli medial equal in supine                      Objective measurements completed on examination: See above findings.     Pelvic Floor Special Questions - 12/24/20 1645    Diastasis Recti 3 fingers width along linea alba            [facility] Adult PT Treatment/Exercise - 12/25/20 1132      Bed Mobility   Bed Mobility --   head lift     Neuro Re-ed    Neuro Re-ed Details  cued for body mechanics to breast feed child, carry, and to sit to stand/ log roll with less pressure downward       Manual Therapy   Manual therapy comments STM/MWM to promote L trunk rotation                       PT Long Term Goals - 12/25/20 1126      PT LONG TERM GOAL #1   Title Pt will decrease FOTO lumbar score from 70 pts to < 50 pts in order to promote ADLs    Time 10    Period Weeks    Status New    Target Date 03/05/21      PT LONG TERM GOAL #2   Title Pt will decreased FOTO score for Prolapse from 21 pts to < 18 pts and Pelvic Pain from 25 pts to < 22 pts in order to improve QOL    Time 10    Period Weeks    Status New    Target Date 03/05/21      PT LONG TERM GOAL #3   Title Pt will demo less abdominal separation from 3 fingers width to < 2 fingers width, improved deep core coordination and strength in order to to optimize IAP system to minimize pain and prolapse    Time 4    Period Weeks    Status New    Target Date 01/22/21      PT LONG TERM GOAL #4   Title Pt will demo equal pelvic girdle alignment and increased R hip abduction strength from 4-/5 to > 5/5 in order to progress to deep core / pelvic floor  exercises and minmize pain    Time 2    Period Weeks    Status New    Target Date 01/08/21      PT LONG TERM GOAL #5   Title Pt will demo proper lengthening and contraction of pelvic floor without compensatory patterns and no cues in order to minimize pain with sex, OB/GYN exams, and improve BMs    Time 76    Period Weeks    Status New    Target Date 02/05/21                  Plan - 12/24/20 1617    Clinical Impression Statement  Pt is a  25  yo  who is presents with  prolapse related sensation, pain with bowel movements, pain with sexual intercourse and OB/GYN exams, and LBP, and fallen arches of her feet. Pt is 9 weeks post partum with her first child. Her L & D included vacuum and perineal tears.   Pt's musculoskeletal assessment revealed uneven pelvic and shoulder height, limited spinal /pelvic mobility, dyscoordination and strength of pelvic  floor mm, weak hip weakness, diastasis recti, and poor body mechanics which places strain on the abdominal/pelvic floor mm.   These are deficits that indicate an ineffective intraabdominal pressure system associated with increased risk for pt's Sx.   Pt was provided education on etiology of Sx with anatomy, physiology explanation with images along with the benefits of customized pelvic PT Tx based on pt's medical conditions and musculoskeletal deficits.  Explained the physiology of deep core mm coordination and roles of pelvic floor function in urination, defecation, sexual function, and postural control with deep core mm system.    Regional interdependent approaches will yield greater benefits in pt's POC. Following Tx today which pt tolerated without complaints, pt demo'd equal alignment of pelvic girdle after R UE quadrant was addressed. Pt's LBP decreased with L sideflexion post Tx. Plan to initiate deep core coordination next session and assess pelvic floor at next session.   Pt benefits from skilled PT    Examination-Activity Limitations Toileting;Lift;Transfers;Bed Mobility;Bend;Carry    Stability/Clinical Decision Making Evolving/Moderate complexity    Clinical Decision Making Moderate    Rehab Potential Good    PT Frequency 1x / week    PT Duration Other (comment)   10   PT Treatment/Interventions Functional mobility training;Stair training;Neuromuscular re-education;Patient/family education;Therapeutic activities;Moist Heat;Traction;Biofeedback;Cryotherapy;Scar mobilization;Manual techniques;Energy conservation;Manual lymph drainage;Taping;Splinting;Compression bandaging;Therapeutic exercise;Joint Manipulations    Consulted and Agree with Plan of Care Patient           Patient will benefit from skilled therapeutic intervention in order to improve the following deficits and impairments:  Postural dysfunction,Increased muscle spasms,Hypermobility,Decreased scar mobility,Decreased  range of motion,Decreased endurance,Decreased activity tolerance,Difficulty walking,Decreased balance,Improper body mechanics,Pain,Increased fascial restricitons,Decreased strength,Hypomobility,Decreased safety awareness,Decreased mobility,Decreased coordination  Visit Diagnosis: Sacrococcygeal disorders, not elsewhere classified  Diastasis recti  Other abnormalities of gait and mobility     Problem List Patient Active Problem List   Diagnosis Date Noted  . Diastasis recti 12/08/2020  . Vacuum-assisted vaginal delivery 10/19/2020  . Second degree perineal laceration 10/19/2020  . Vaginal laceration 10/19/2020  . Encounter for induction of labor 10/18/2020  . COVID-19 virus infection 09/15/2020  . Pregnant 09/15/2020  . Tachycardia 08/27/2020  . Family history of gestational diabetes mellitus (GDM) in mother 03/20/2020  . Type A blood, Rh positive 03/20/2020  . Menstrual changes 02/05/2020  . Syncope 01/19/2020  . Hemorrhoids 05/19/2019  . Constipation 05/19/2019  . Menorrhagia with regular cycle 10/02/2017    Mariane Masters ,PT, DPT, E-RYT  12/25/2020, 11:34 AM  [facility] MAIN [facility] SERVICES [address] [place], [place], [address] Phone: [phone]   Fax:  [phone]  Name: Linda Mendez MRN: [id number] Date of Birth: Dec 26, 1995

## 2020-12-28 ENCOUNTER — Ambulatory Visit: Payer: BC Managed Care – PPO | Admitting: Physical Therapy

## 2020-12-28 DIAGNOSIS — M533 Sacrococcygeal disorders, not elsewhere classified: Secondary | ICD-10-CM

## 2020-12-28 DIAGNOSIS — R2689 Other abnormalities of gait and mobility: Secondary | ICD-10-CM

## 2020-12-28 DIAGNOSIS — M6208 Separation of muscle (nontraumatic), other site: Secondary | ICD-10-CM

## 2020-12-28 NOTE — Patient Instructions (Signed)
Abdominal massage   Deep core level 1 and 2   (handout)

## 2020-12-29 NOTE — Therapy (Signed)
[place] [facility] MAIN [facility] SERVICES [address] [place], [place], [address] Phone: [phone]   Fax:  [phone]  Physical Therapy Treatment  Patient Details  Name: Linda Mendez MRN: [id number] Date of Birth: 1996-07-20 Referring Provider (PT): Jeralyn Bennett   Encounter Date: 12/28/2020   PT End of Session - 12/28/20 1514    Visit Number 2    Number of Visits 10    Date for PT Re-Evaluation 03/04/21    PT Start Time 1507    PT Stop Time 1600    PT Time Calculation (min) 53 min           Past Medical History:  Diagnosis Date  . Arm fracture, left   . Hemorrhoids   . Mononucleosis     Past Surgical History:  Procedure Laterality Date  . arm surgery    . blocked tear duct surgery    . FRACTURE SURGERY Left 2003   Left arm     There were no vitals filed for this visit.   Subjective Assessment - 12/28/20 1511    Subjective Pt went out of town to visit her parents and she did alot of stairs and noticed more pressure than normal . Pt felt sore after last session but it went away    Patient Stated Goals Continue with postpartum workout " Expecting and Empowered" workout, decrease pain with bowel movements, improve separation in abdomen              [facility] PT Assessment - 12/29/20 0924      Palpation   Palpation comment limited anterior lateral excursion /depression of ribs, abdominal tensions/ holding                      Pelvic Floor Special Questions - 12/29/20 0923    Diastasis Recti 2 fingers width             [facility] Adult PT Treatment/Exercise - 12/29/20 0926      Therapeutic Activites    Other Therapeutic Activities reviewed Post partum workout program exercises, explained which ones to modify ( not perform head lift and provided explanation for pelvic health and DRA)      Neuro Re-ed    Neuro Re-ed Details  cued for deep core coordination with excessive cues to not overuse upper ab mm      Manual Therapy    Manual therapy comments quadruped: fascial mobilization with UE /trunk movement, supine: abdominal fascial mobilization,                       PT Long Term Goals - 12/25/20 1126      PT LONG TERM GOAL #1   Title Pt will decrease FOTO lumbar score from 70 pts to < 50 pts in order to promote ADLs    Time 10    Period Weeks    Status New    Target Date 03/05/21      PT LONG TERM GOAL #2   Title Pt will decreased FOTO score for Prolapse from 21 pts to < 18 pts and Pelvic Pain from 25 pts to < 22 pts in order to improve QOL    Time 10    Period Weeks    Status New    Target Date 03/05/21      PT LONG TERM GOAL #3   Title Pt will demo less abdominal separation from 3 fingers width to < 2 fingers width,  improved deep core coordination and strength in order to to optimize IAP system to minimize pain and prolapse    Time 4    Period Weeks    Status New    Target Date 01/22/21      PT LONG TERM GOAL #4   Title Pt will demo equal pelvic girdle alignment and increased R hip abduction strength from 4-/5 to > 5/5 in order to progress to deep core / pelvic floor  exercises and minmize pain    Time 2    Period Weeks    Status New    Target Date 01/08/21      PT LONG TERM GOAL #5   Title Pt will demo proper lengthening and contraction of pelvic floor without compensatory patterns and no cues in order to minimize pain with sex, OB/GYN exams, and have BMs without pain    Time 76    Period Weeks    Status New    Target Date 02/05/21      Additional Long Term Goals   Additional Long Term Goals Yes                  ) Plan - 12/28/20 1514    Clinical Impression Statement Pt demo'd levelled pelvic girdle and spine today which is good carry over from last session. Further required manual Tx to address DRA which is improving. Pt requried cues for les ab overuse in deep core coordination which will help minimize prolapse and optimize IAP system. Reviewed Post partum  workout program exercises, explained which ones to modify (not perform head lift and provided explanation for pelvic health and DRA).   Pt continues to benefit from skilled PT     Examination-Activity Limitations Toileting;Lift;Transfers;Bed Mobility;Bend;Carry    Stability/Clinical Decision Making Evolving/Moderate complexity    Rehab Potential Good    PT Frequency 1x / week    PT Duration Other (comment)   10   PT Treatment/Interventions Functional mobility training;Stair training;Neuromuscular re-education;Patient/family education;Therapeutic activities;Moist Heat;Traction;Biofeedback;Cryotherapy;Scar mobilization;Manual techniques;Energy conservation;Manual lymph drainage;Taping;Splinting;Compression bandaging;Therapeutic exercise;Joint Manipulations    Consulted and Agree with Plan of Care Patient           Patient will benefit from skilled therapeutic intervention in order to improve the following deficits and impairments:  Postural dysfunction,Increased muscle spasms,Hypermobility,Decreased scar mobility,Decreased range of motion,Decreased endurance,Decreased activity tolerance,Difficulty walking,Decreased balance,Improper body mechanics,Pain,Increased fascial restricitons,Decreased strength,Hypomobility,Decreased safety awareness,Decreased mobility,Decreased coordination  Visit Diagnosis: Sacrococcygeal disorders, not elsewhere classified  Diastasis recti  Other abnormalities of gait and mobility     Problem List Patient Active Problem List   Diagnosis Date Noted  . Diastasis recti 12/08/2020  . Vacuum-assisted vaginal delivery 10/19/2020  . Second degree perineal laceration 10/19/2020  . Vaginal laceration 10/19/2020  . Encounter for induction of labor 10/18/2020  . COVID-19 virus infection 09/15/2020  . Pregnant 09/15/2020  . Tachycardia 08/27/2020  . Family history of gestational diabetes mellitus (GDM) in mother 03/20/2020  . Type A blood, Rh positive 03/20/2020   . Menstrual changes 02/05/2020  . Syncope 01/19/2020  . Hemorrhoids 05/19/2019  . Constipation 05/19/2019  . Menorrhagia with regular cycle 10/02/2017    Mariane Masters ,PT, DPT, E-RYT  12/29/2020, 9:38 AM  [place] [facility] MAIN [facility] SERVICES [address] [place], [place], [address] Phone: [phone]   Fax:  [phone]  Name: Saffron Busey MRN: [id number] Date of Birth: 1995/12/10

## 2020-12-30 ENCOUNTER — Ambulatory Visit: Payer: BC Managed Care – PPO | Admitting: Physical Therapy

## 2021-01-04 ENCOUNTER — Ambulatory Visit: Payer: BC Managed Care – PPO | Admitting: Physical Therapy

## 2021-01-04 ENCOUNTER — Other Ambulatory Visit: Payer: Self-pay

## 2021-01-04 DIAGNOSIS — M533 Sacrococcygeal disorders, not elsewhere classified: Secondary | ICD-10-CM

## 2021-01-04 DIAGNOSIS — R2689 Other abnormalities of gait and mobility: Secondary | ICD-10-CM

## 2021-01-04 DIAGNOSIS — M6208 Separation of muscle (nontraumatic), other site: Secondary | ICD-10-CM

## 2021-01-04 NOTE — Patient Instructions (Signed)
Stretch for pelvic floor   V- slides  "v heels slide away and then back toward buttocks and then rock knee to slight ,  slide heel along at 11 o clock away from buttocks   10 reps      On belly: Riding horse edge of mattress  knee bent like riding a horse, move knee towards armpit and out  10 reps   Mermaid stretch  Rocking while seated on the floor with heels to one side of the hip Heels to one side of the hip  Rock forward towards the knee that is bent , rock beck towards the opposite sitting bones   ___     Transition from standing to floor :  stand to floor transfer :      _ slow     _ mini squat      _ crawl down with one hand on thigh      _downward dog  - >  shoulders down and back-  walk the dog ( knee bents to lengthe hamstrings)      Floor to stand :   downward dog   crawl hands back, butt is back, knees behind toes -> squat  Hands at waist , elbows back, chest lifts      

## 2021-01-04 NOTE — Therapy (Signed)
[facility] [facility] MAIN [facility] SERVICES [address] [place], [place], [address] Phone: [phone]   Fax:  [phone]  Physical Therapy Treatment  Patient Details  Name: Linda Mendez MRN: [id number] Date of Birth: [birth year] Referring Provider (PT): Jeralyn Bennett   Encounter Date: 01/04/2021   PT End of Session - 01/04/21 1512    Visit Number 3    Number of Visits 10    Date for PT Re-Evaluation 03/04/21    PT Start Time 1506    PT Stop Time 1600    PT Time Calculation (min) 54 min           Past Medical History:  Diagnosis Date  . Arm fracture, left   . Hemorrhoids   . Mononucleosis     Past Surgical History:  Procedure Laterality Date  . arm surgery    . blocked tear duct surgery    . FRACTURE SURGERY Left 2003   Left arm     There were no vitals filed for this visit.   Subjective Assessment - 01/04/21 1516    Subjective Pt felt frustrated about deep core exercise because she could feel when she was not doing it correctly. 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 She can sit on chair when need.    Patient Stated Goals Continue with postpartum workout " Expecting and Empowered" workout, decrease pain with bowel movements, improve separation in abdomen              [facility] PT Assessment - 01/04/21 1601      Coordination   Coordination and Movement Description improved diaphragmatic coordination, pelvic tilts, poor co-activation of feet      Palpation   Palpation comment tightness of adductors R                      Pelvic Floor Special Questions - 01/04/21 1558    Diastasis Recti 1 fingers width    Falling out feeling (prolapse) --   cough with slight lowering of posterior, inside introitus   External Perineal Exam tightness at L siciocavernosus/ischioanal fossa attachments R             [facility] Adult PT Treatment/Exercise - 01/04/21 1604      Neuro Re-ed    Neuro Re-ed Details  cued  for standing with less hyperextended knees/ more feet co-activation,  pelvic tilts, co=activation of feet , floor to rise t/f and log rolling with less bearing down      Manual Therapy   Manual therapy comments external: STM/MWM at areas noted in assessment                       PT Long Term Goals - 12/25/20 1126      PT LONG TERM GOAL #1   Title Pt will decrease FOTO lumbar score from 70 pts to < 50 pts in order to promote ADLs    Time 10    Period Weeks    Status New    Target Date 03/05/21      PT LONG TERM GOAL #2   Title Pt will decreased FOTO score for Prolapse from 21 pts to < 18 pts and Pelvic Pain from 25 pts to < 22 pts in order to improve QOL    Time 10    Period Weeks    Status New    Target Date 03/05/21  PT LONG TERM GOAL #3   Title Pt will demo less abdominal separation from 3 fingers width to < 2 fingers width, improved deep core coordination and strength in order to to optimize IAP system to minimize pain and prolapse    Time 4    Period Weeks    Status New    Target Date 01/22/21      PT LONG TERM GOAL #4   Title Pt will demo equal pelvic girdle alignment and increased R hip abduction strength from 4-/5 to > 5/5 in order to progress to deep core / pelvic floor  exercises and minmize pain    Time 2    Period Weeks    Status New    Target Date 01/08/21      PT LONG TERM GOAL #5   Title Pt will demo proper lengthening and contraction of pelvic floor without compensatory patterns and no cues in order to minimize pain with sex, OB/GYN exams, and have BMs without pain    Time 76    Period Weeks    Status New    Target Date 02/05/21      Additional Long Term Goals   Additional Long Term Goals Yes                 Plan - 01/04/21 1605    Clinical Impression Statement Pt demo'd improved coordination of deep core system. Pelvic floor external assessment showed increased tightness at R side which decreased post Tx. Pt required cues for  more feet-co-activation to relax pelvic floor and adductors. 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 These body mechanicss will help minimize worsening of prolapse.  Pt's DRA has improved. Pt continues to benefit from skilled PT. Plan to work on lower kinetic chain with deep core coordination next session and perform internal assessment.    Examination-Activity Limitations Toileting;Lift;Transfers;Bed Mobility;Bend;Carry    Stability/Clinical Decision Making Evolving/Moderate complexity    Rehab Potential Good    PT Frequency 1x / week    PT Duration Other (comment)   10   PT Treatment/Interventions Functional mobility training;Stair training;Neuromuscular re-education;Patient/family education;Therapeutic activities;Moist Heat;Traction;Biofeedback;Cryotherapy;Scar mobilization;Manual techniques;Energy conservation;Manual lymph drainage;Taping;Splinting;Compression bandaging;Therapeutic exercise;Joint Manipulations    Consulted and Agree with Plan of Care Patient           Patient will benefit from skilled therapeutic intervention in order to improve the following deficits and impairments:  Postural dysfunction,Increased muscle spasms,Hypermobility,Decreased scar mobility,Decreased range of motion,Decreased endurance,Decreased activity tolerance,Difficulty walking,Decreased balance,Improper body mechanics,Pain,Increased fascial restricitons,Decreased strength,Hypomobility,Decreased safety awareness,Decreased mobility,Decreased coordination  Visit Diagnosis: Sacrococcygeal disorders, not elsewhere classified  Diastasis recti  Other abnormalities of gait and mobility     Problem List Patient Active Problem List   Diagnosis Date Noted  . Diastasis recti 12/08/2020  . Vacuum-assisted vaginal delivery 10/19/2020  . Second degree perineal laceration 10/19/2020  . Vaginal laceration 10/19/2020  .  Encounter for induction of labor 10/18/2020  . COVID-19 virus infection 09/15/2020  . Pregnant 09/15/2020  . Tachycardia 08/27/2020  . Family history of gestational diabetes mellitus (GDM) in mother 03/20/2020  . Type A blood, Rh positive 03/20/2020  . Menstrual changes 02/05/2020  . Syncope 01/19/2020  . Hemorrhoids 05/19/2019  . Constipation 05/19/2019  . Menorrhagia with regular cycle 10/02/2017    Mariane Masters ,PT, DPT, E-RYT  01/04/2021, 4:06 PM  [place] [facility] MAIN [facility] SERVICES [address] [place], [place], [address] Phone: [phone]   Fax:  [phone]  Name: Brenae Lasecki MRN: [id number] Date of Birth: 21-Feb-1996

## 2021-01-05 ENCOUNTER — Telehealth: Payer: Self-pay | Admitting: Certified Nurse Midwife

## 2021-01-05 NOTE — Telephone Encounter (Signed)
Pt called and was asking if her paperwork was ready that she dropped off yesterday.  She stated that Pattricia Boss was suppose to sign.

## 2021-01-06 ENCOUNTER — Encounter: Payer: BC Managed Care – PPO | Admitting: Physical Therapy

## 2021-01-06 NOTE — Telephone Encounter (Signed)
Paperwork has been completed and signed. Gave forms to [facility] to fax and notify patient.

## 2021-01-13 ENCOUNTER — Encounter: Payer: BC Managed Care – PPO | Admitting: Physical Therapy

## 2021-01-18 ENCOUNTER — Encounter: Payer: BC Managed Care – PPO | Admitting: Physical Therapy

## 2021-01-20 ENCOUNTER — Encounter: Payer: BC Managed Care – PPO | Admitting: Physical Therapy

## 2021-01-25 ENCOUNTER — Encounter: Payer: BC Managed Care – PPO | Admitting: Physical Therapy

## 2021-02-01 ENCOUNTER — Encounter: Payer: BC Managed Care – PPO | Admitting: Physical Therapy

## 2021-02-03 ENCOUNTER — Encounter: Payer: BC Managed Care – PPO | Admitting: Physical Therapy

## 2021-02-04 ENCOUNTER — Ambulatory Visit: Payer: BC Managed Care – PPO | Admitting: Physical Therapy

## 2021-02-08 ENCOUNTER — Encounter: Payer: BC Managed Care – PPO | Admitting: Physical Therapy

## 2021-02-10 ENCOUNTER — Encounter: Payer: BC Managed Care – PPO | Admitting: Physical Therapy

## 2021-02-11 ENCOUNTER — Ambulatory Visit: Payer: BC Managed Care – PPO | Attending: Certified Nurse Midwife | Admitting: Physical Therapy

## 2021-02-11 ENCOUNTER — Other Ambulatory Visit: Payer: Self-pay

## 2021-02-11 DIAGNOSIS — M6208 Separation of muscle (nontraumatic), other site: Secondary | ICD-10-CM

## 2021-02-11 DIAGNOSIS — M533 Sacrococcygeal disorders, not elsewhere classified: Secondary | ICD-10-CM | POA: Diagnosis present

## 2021-02-11 DIAGNOSIS — R2689 Other abnormalities of gait and mobility: Secondary | ICD-10-CM | POA: Diagnosis present

## 2021-02-11 NOTE — Patient Instructions (Signed)
Lying on back, knees bent    band under ballmounds  while laying on back w/ knees bent  "W" exercise  10 reps x 2 sets   Band is placed under feet, knees bent, feet are hip width apart Hold band with thumbs point out, keep upper arm and elbow touching the bed the whole time  - inhale and then exhale pull bands by bending elbows hands move in a "w"  (feel shoulder blades squeezing)    _  Neck / shoulder stretches:    Lying on back - small sushi roll towel under neck  _ 6 directions  5 reps  _elbow circles in and out to lower shoulder blades down and back  10 reps  _angel wings, lower elbows down , keep arms touching bed  10 reps    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

## 2021-02-12 NOTE — Therapy (Signed)
[facility] MAIN [facility] SERVICES [address] [place], [place], [address] Phone: [phone]   Fax:  [phone]  Physical Therapy Treatment  Patient Details  Name: Linda Mendez MRN: [id number] Date of Birth: [birth year] Referring Provider (PT): Jeralyn Bennett   Encounter Date: 02/11/2021   PT End of Session - 02/11/21 1508     Visit Number 4    Number of Visits 10    Date for PT Re-Evaluation 03/04/21    PT Start Time 1500    PT Stop Time 1600    PT Time Calculation (min) 60 min    Activity Tolerance Patient tolerated treatment well    Behavior During Therapy [facility] for tasks assessed/performed             Past Medical History:  Diagnosis Date   Arm fracture, left    Hemorrhoids    Mononucleosis     Past Surgical History:  Procedure Laterality Date   arm surgery     blocked tear duct surgery     FRACTURE SURGERY Left 2003   Left arm     There were no vitals filed for this visit.   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 Pt felt pelvic pain. Pt has had a bumpy ride with getting sick and vacation. Pain is low because she is aware of her limits.Pt is able to do more. Pt is noticing she does not have pelvic pain when holding her baby. Pt does not need her husband to assist  her out of bed or chair.    Patient Stated Goals Continue with postpartum workout " Expecting and Empowered" workout, decrease pain with bowel movements, improve separation in abdomen                [facility] PT Assessment - 02/12/21 1246       Coordination   Coordination and Movement Description dyscoordination, upper trap dominant with inhalation, no pelvic floor lengthening                        Pelvic Floor Special Questions - 02/12/21 1245     Pelvic Floor Internal Exam pt consented verbally and had no contraindications    Exam Type Vaginal    Palpation  tightness and tenderness at obt int, 9 clock 1-3 layers, perineal restrictions   spotting of blood on therapist glove post Tx              [facility] Adult PT Treatment/Exercise - 02/12/21 1248       Neuro Re-ed    Neuro Re-ed Details  cued for proper diaphragm excursion to optimize pelvic floor lengthening, less upper trap overuse, cued for scapular strengthening with band      Modalities   Modalities Moist Heat      Moist Heat Therapy   Number Minutes Moist Heat 5 Minutes    Moist Heat Location --   perineum, guided relaxation     Manual Therapy   Manual therapy comments ischial rami mm attachment releases on L,  paraspinal mm releases over neck and shoulder to minimize upper trap overuse    Internal Pelvic Floor STM/MWM fascial mobilization at problem areas  lighter pressure and distal placement to scar to minimize tenderness)  PT Long Term Goals - 02/11/21 1509       PT LONG TERM GOAL #1   Title Pt will decrease FOTO lumbar score from 70 pts to < 50 pts in order to promote ADLs    Time 10    Period Weeks    Status On-going      PT LONG TERM GOAL #2   Title Pt will decreased FOTO score for Prolapse from 21 pts to < 18 pts and Pelvic Pain from 25 pts to < 22 pts in order to improve QOL    Time 10    Period Weeks    Status On-going      PT LONG TERM GOAL #3   Title Pt will demo less abdominal separation from 3 fingers width to < 2 fingers width, improved deep core coordination and strength in order to to optimize IAP  system to minimize pain and prolapse ( 6/30: 1 finger)    Time 4    Period Weeks    Status Achieved      PT LONG TERM GOAL #4   Title Pt will demo equal pelvic girdle alignment and increased R hip abduction strength from 4-/5 to > 5/5 in order to progress to deep core / pelvic floor  exercises and minmize pain ( 01/15/21" 5/5 B)    Time 2    Period Weeks    Status Achieved      PT LONG TERM GOAL #5   Title Pt will  demo proper lengthening and contraction of pelvic floor without compensatory patterns and no cues in order to minimize pain with sex, OB/GYN exams, and have BMs without pain    Time 76    Period Weeks    Status On-going                   Plan - 02/12/21 1243     Clinical Impression Statement  Pt is noticing she does not have pelvic pain when holding her baby. Pt does not need her husband to assist  her out of bed or chair.  Pt's DRA and pelvic/ spinal alignment has improved. But her forward  head and pelvic floor tightness still need to be addressed.  Pt showed significantly restricted perineal scars with internal assessment today. Internal Tx was modified with lighter pressure and distal placement of pressure with MWM techniques which pt tolerated better. Deferred to external Tx and focusing on helping pt minimize upper trap overuse with deep core and to promote more stacked posture, less forward head posture, and more diaphragmatic/ pelvic floor excursion. Cued for proper scapulothoracic strengthening HEP, neck stretches,  and less chest breathing with deep core exercises  in addition to less posterior tilt of pelvis to promote optimal pelvic floor lengthening.  Pt continues to benefit from skilled PT.     Examination-Activity Limitations Toileting;Lift;Transfers;Bed Mobility;Bend;Carry    Stability/Clinical Decision Making Evolving/Moderate complexity    Rehab Potential Good    PT Frequency 1x / week    PT Duration Other (comment)   10   PT Treatment/Interventions Functional mobility training;Stair training;Neuromuscular re-education;Patient/family education;Therapeutic activities;Moist Heat;Traction;Biofeedback;Cryotherapy;Scar mobilization;Manual techniques;Energy conservation;Manual lymph drainage;Taping;Splinting;Compression bandaging;Therapeutic exercise;Joint Manipulations    Consulted and Agree with Plan of Care Patient             Patient will benefit from skilled  therapeutic intervention in order to improve the following deficits and impairments:  Postural dysfunction, Increased muscle spasms, Hypermobility, Decreased scar mobility, Decreased range of motion, Decreased endurance,  Decreased activity tolerance, Difficulty walking, Decreased balance, Improper body mechanics, Pain, Increased fascial restricitons, Decreased strength, Hypomobility, Decreased safety awareness, Decreased mobility, Decreased coordination  Visit Diagnosis: Sacrococcygeal disorders, not elsewhere classified  Diastasis recti  Other abnormalities of gait and mobility     Problem List Patient Active Problem List   Diagnosis Date Noted   Diastasis recti 12/08/2020   Vacuum-assisted vaginal delivery 10/19/2020   Second degree perineal laceration 10/19/2020   Vaginal laceration 10/19/2020   Encounter for induction of labor 10/18/2020   COVID-19 virus infection 09/15/2020   Pregnant 09/15/2020   Tachycardia 08/27/2020   Family history of gestational diabetes mellitus (GDM) in mother 03/20/2020   Type A blood, Rh positive 03/20/2020   Menstrual changes 02/05/2020   Syncope 01/19/2020   Hemorrhoids 05/19/2019   Constipation 05/19/2019   Menorrhagia with regular cycle 10/02/2017    Mariane Masters ,PT, DPT, E-RYT  02/12/2021, 12:52 PM   [facility] MAIN [facility] SERVICES [address] [place], [place], [address] Phone: [phone]   Fax:  [phone]  Name: Linda Mendez MRN: [id number] Date of Birth: [birth year]

## 2021-02-25 ENCOUNTER — Ambulatory Visit: Payer: BC Managed Care – PPO | Attending: Certified Nurse Midwife | Admitting: Physical Therapy

## 2021-02-25 ENCOUNTER — Other Ambulatory Visit: Payer: Self-pay

## 2021-02-25 DIAGNOSIS — R2689 Other abnormalities of gait and mobility: Secondary | ICD-10-CM | POA: Diagnosis present

## 2021-02-25 DIAGNOSIS — M533 Sacrococcygeal disorders, not elsewhere classified: Secondary | ICD-10-CM | POA: Diagnosis not present

## 2021-02-25 DIAGNOSIS — M6208 Separation of muscle (nontraumatic), other site: Secondary | ICD-10-CM | POA: Diagnosis present

## 2021-02-25 NOTE — Therapy (Signed)
[facility] MAIN [facility] SERVICES [address] [place], [place], [address] Phone: [phone]   Fax:  [phone]  Physical Therapy Treatment  Patient Details  Name: Linda Mendez MRN: [id number] Date of Birth: 07-16-1996 Referring Provider (PT): Jeralyn Bennett   Encounter Date: 02/25/2021   PT End of Session - 02/25/21 1554     Visit Number 5    Number of Visits 10    Date for PT Re-Evaluation 03/04/21    PT Start Time 1503    PT Stop Time 1600    PT Time Calculation (min) 57 min    Activity Tolerance Patient tolerated treatment well    Behavior During Therapy [facility] for tasks assessed/performed             Past Medical History:  Diagnosis Date   Arm fracture, left    Hemorrhoids    Mononucleosis     Past Surgical History:  Procedure Laterality Date   arm surgery     blocked tear duct surgery     FRACTURE SURGERY Left 2003   Left arm     There were no vitals filed for this visit.   Subjective Assessment - 02/25/21 1507     Subjective Pt reported she gets a knot in middle of her back at random times in the day. Pt noticed she tightens her core during the time and wants to know whenis the core suppose to be relaxed    Patient Stated Goals Continue with postpartum workout " Expecting and Empowered" workout, decrease pain with bowel movements, improve separation in abdomen                [facility] PT Assessment - 02/25/21 1512       Palpation   Spinal mobility R s houlder slightly higher, levelled iliac crest, tightness along interspinals T8-12 L                        Pelvic Floor Special Questions - 02/25/21 1551     Pelvic Floor Internal Exam pt consented verbally and had no contraindications    Exam Type Vaginal    Palpation tightness 9-3 o'clock  and less tenderness this session   spotting of blood on therapist glove post Tx              [facility] Adult PT Treatment/Exercise - 02/25/21 1552       Neuro  Re-ed    Neuro Re-ed Details  cued for anerior tilt, less cues for diaphragmatic excursion      Modalities   Modalities Moist Heat      Moist Heat Therapy   Number Minutes Moist Heat 10 Minutes    Moist Heat Location Other (comment)   thoracic during pelvic floor Tx     Manual Therapy   Manual therapy comments STM/MWM at areas noted in assessment    Internal Pelvic Floor STM/MWM at areas noted in assessment                         PT Long Term Goals - 02/11/21 1509       PT LONG TERM GOAL #1   Title Pt will decrease FOTO lumbar score from 70 pts to < 50 pts in order to promote ADLs    Time 10    Period Weeks    Status On-going      PT LONG TERM GOAL #2   Title Pt  will decreased FOTO score for Prolapse from 21 pts to < 18 pts and Pelvic Pain from 25 pts to < 22 pts in order to improve QOL    Time 10    Period Weeks    Status On-going      PT LONG TERM GOAL #3   Title Pt will demo less abdominal separation from 3 fingers width to < 2 fingers width, improved deep core coordination and strength in order to to optimize IAP  system to minimize pain and prolapse ( 6/30: 1 finger)    Time 4    Period Weeks    Status Achieved      PT LONG TERM GOAL #4   Title Pt will demo equal pelvic girdle alignment and increased R hip abduction strength from 4-/5 to > 5/5 in order to progress to deep core / pelvic floor  exercises and minmize pain ( 01/15/21" 5/5 B)    Time 2    Period Weeks    Status Achieved      PT LONG TERM GOAL #5   Title Pt will demo proper lengthening and contraction of pelvic floor without compensatory patterns and no cues in order to minimize pain with sex, OB/GYN exams, and have BMs without pain    Time 76    Period Weeks    Status On-going                   Plan - 02/25/21 1554     Clinical Impression Statement Pt demo'd decreasing tightness at pelvic floor mm  and will be ready for more pelvic floor training at next session. Pt required  less cues for diaphragmatic excursion and did not show abdominal overuse which is a good carry over. Discussed sleeping on back or side and not on her belly to minimize elevated R shoulder. Manual Tx also addressed tightness at thoracic spine which was tighter at L interspinals. Pt demo'd more depression of posterior ribs which will help with her IAP system and deep core coordination practice. Pt continues to benefit from skilled PT   Examination-Activity Limitations Toileting;Lift;Transfers;Bed Mobility;Bend;Carry    Stability/Clinical Decision Making Evolving/Moderate complexity    Rehab Potential Good    PT Frequency 1x / week    PT Duration Other (comment)   10   PT Treatment/Interventions Functional mobility training;Stair training;Neuromuscular re-education;Patient/family education;Therapeutic activities;Moist Heat;Traction;Biofeedback;Cryotherapy;Scar mobilization;Manual techniques;Energy conservation;Manual lymph drainage;Taping;Splinting;Compression bandaging;Therapeutic exercise;Joint Manipulations    Consulted and Agree with Plan of Care Patient             Patient will benefit from skilled therapeutic intervention in order to improve the following deficits and impairments:  Postural dysfunction, Increased muscle spasms, Hypermobility, Decreased scar mobility, Decreased range of motion, Decreased endurance, Decreased activity tolerance, Difficulty walking, Decreased balance, Improper body mechanics, Pain, Increased fascial restricitons, Decreased strength, Hypomobility, Decreased safety awareness, Decreased mobility, Decreased coordination  Visit Diagnosis: Sacrococcygeal disorders, not elsewhere classified  Diastasis recti  Other abnormalities of gait and mobility     Problem List Patient Active Problem List   Diagnosis Date Noted   Diastasis recti 12/08/2020   Vacuum-assisted vaginal delivery 10/19/2020   Second degree perineal laceration 10/19/2020   Vaginal laceration  10/19/2020   Encounter for induction of labor 10/18/2020   COVID-19 virus infection 09/15/2020   Pregnant 09/15/2020   Tachycardia 08/27/2020   Family history of gestational diabetes mellitus (GDM) in mother 03/20/2020   Type A blood, Rh positive 03/20/2020   Menstrual changes 02/05/2020  Syncope 01/19/2020   Hemorrhoids 05/19/2019   Constipation 05/19/2019   Menorrhagia with regular cycle 10/02/2017    Mariane Masters ,PT, DPT, E-RYT  02/25/2021, 3:55 PM  [place] [facility] MAIN [facility] SERVICES [address] [place], [place], [address] Phone: [phone]   Fax:  [phone]  Name: Linda Mendez MRN: [id number] Date of Birth: [birth year]

## 2021-03-04 ENCOUNTER — Ambulatory Visit: Payer: BC Managed Care – PPO | Admitting: Physical Therapy

## 2021-03-04 ENCOUNTER — Other Ambulatory Visit: Payer: Self-pay

## 2021-03-04 DIAGNOSIS — M6208 Separation of muscle (nontraumatic), other site: Secondary | ICD-10-CM

## 2021-03-04 DIAGNOSIS — M533 Sacrococcygeal disorders, not elsewhere classified: Secondary | ICD-10-CM | POA: Diagnosis not present

## 2021-03-04 DIAGNOSIS — R2689 Other abnormalities of gait and mobility: Secondary | ICD-10-CM

## 2021-03-04 NOTE — Patient Instructions (Signed)
Locust pose  Pillow under hips if needed for decreased low back pain  Palms face midline by hips  Finger tips shooting straight down  Imagine holding pencil under your armpits Draw shoulders away from ears Inhale Exhale lift chest up slightly without feeling it in your back. The bend happens in the midback  (keep chin tucked)   __  Low cobra:  On belly, palms under armpits, elbows pointed to ceiling  Inhale: lengthen crown of the head away from shoulders Exhale, feel belly hug in and press palms into the floor, squeezing elbows/shoulder blades towards each other while chest lifts about 5 cm off of the floor. You should feel the hinging movement at mid back and not the low back.  ___  Modified Birddog  Table top position,  6 points of contact: paw hands, knees hip width apart, toes tucked under  Shoulders down and back like squeezing armpits  Not sagging back   L arm up , thumbs up, arm is out like a half"V" shoulder blade slides down and back  R knee straight, toes tucked on the ground,  Lengthen whole spine as if yard stick is balanced on spine, chin tucked   L + R = 1 rep 10 reps   X 2   ___  Shoulder and chin press while breastbreeding  ___  Positions and pelvic functions

## 2021-03-04 NOTE — Therapy (Signed)
[facility] [facility] MAIN [facility] SERVICES [address] [place], [place], [address] Phone: [phone]   Fax:  [phone]  Physical Therapy Treatment  Patient Details  Name: Linda Mendez MRN: [id number] Date of Birth: 1995-10-05 Referring Provider (PT): Jeralyn Bennett   Encounter Date: 03/04/2021   PT End of Session - 03/04/21 1456     Visit Number 6    Number of Visits 10    Date for PT Re-Evaluation 03/04/21    PT Start Time 1507    PT Stop Time 1600    PT Time Calculation (min) 53 min    Activity Tolerance Patient tolerated treatment well    Behavior During Therapy [facility] for tasks assessed/performed             Past Medical History:  Diagnosis Date   Arm fracture, left    Hemorrhoids    Mononucleosis     Past Surgical History:  Procedure Laterality Date   arm surgery     blocked tear duct surgery     FRACTURE SURGERY Left 2003   Left arm     There were no vitals filed for this visit.   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    Patient Stated Goals Continue with postpartum workout " Expecting and Empowered" workout, decrease pain with bowel movements, improve separation in abdomen                [facility] PT Assessment - 03/04/21 1605       Observation/Other Assessments   Observations improved posture , less forward head      Other:   Other/ Comments quadriped, hyperexted elbows, no toe extension for stability, pelvic pertubration in birddog .  lumbar lordosis in prone swimmers ( withheld)                           [facility] Adult PT Treatment/Exercise - 03/04/21 1609       Neuro Re-ed    Neuro Re-ed Details  cued for postural stability, less hyperextended elbows for anti gravity exercises                         PT Long Term Goals - 03/04/21 1553       PT LONG TERM GOAL #1    Title Pt will decrease FOTO lumbar score from 70 pts to < 50 pts in order to promote ADLs    Time 10    Period Weeks    Status On-going      PT LONG TERM GOAL #2   Title Pt will decreased FOTO score for Prolapse from 21 pts to < 18 pts and Pelvic Pain from 25 pts to < 22 pts in order to improve QOL    Time 10    Period Weeks    Status On-going      PT LONG TERM GOAL #3   Title Pt will demo less abdominal separation from 3 fingers width to < 2 fingers width, improved deep core coordination and strength in order to to optimize IAP  system to minimize pain and prolapse ( 6/30: 1 finger)    Time 4    Period Weeks    Status Achieved      PT LONG TERM GOAL #4   Title Pt will  demo equal pelvic girdle alignment and increased R hip abduction strength from 4-/5 to > 5/5 in order to progress to deep core / pelvic floor  exercises and minmize pain ( 01/15/21" 5/5 B)    Time 2    Period Weeks    Status Achieved      PT LONG TERM GOAL #5   Title Pt will demo proper lengthening and contraction of pelvic floor without compensatory patterns and no cues in order to minimize pain with sex, OB/GYN exams, and have BMs without pain    Time 76    Period Weeks    Status On-going                   Plan - 03/04/21 1456     Clinical Impression Statement Pt demo'd improved posture, less forward head and progressed to cervico-thoracic/ scapular strengthening. Pt required cues to minimize hyperextension of elbows and lumbar lordosis. Withheld swimmers exercise due to increased lumbar lordosis and deferred to bird dog with CKC of BLE. Pt demo'd improved thoracic extension/ cervical retraction post Tx. Discussed pelvic floor control in functional positions to minimize pelvic pain. Pt continues to benefit from skilled PT. Plan to reassess pelvic floor next session.    Examination-Activity Limitations Toileting;Lift;Transfers;Bed Mobility;Bend;Carry    Stability/Clinical Decision Making Evolving/Moderate  complexity    Rehab Potential Good    PT Frequency 1x / week    PT Duration Other (comment)   10   PT Treatment/Interventions Functional mobility training;Stair training;Neuromuscular re-education;Patient/family education;Therapeutic activities;Moist Heat;Traction;Biofeedback;Cryotherapy;Scar mobilization;Manual techniques;Energy conservation;Manual lymph drainage;Taping;Splinting;Compression bandaging;Therapeutic exercise;Joint Manipulations    Consulted and Agree with Plan of Care Patient             Patient will benefit from skilled therapeutic intervention in order to improve the following deficits and impairments:  Postural dysfunction, Increased muscle spasms, Hypermobility, Decreased scar mobility, Decreased range of motion, Decreased endurance, Decreased activity tolerance, Difficulty walking, Decreased balance, Improper body mechanics, Pain, Increased fascial restricitons, Decreased strength, Hypomobility, Decreased safety awareness, Decreased mobility, Decreased coordination  Visit Diagnosis: Sacrococcygeal disorders, not elsewhere classified  Diastasis recti  Other abnormalities of gait and mobility     Problem List Patient Active Problem List   Diagnosis Date Noted   Diastasis recti 12/08/2020   Vacuum-assisted vaginal delivery 10/19/2020   Second degree perineal laceration 10/19/2020   Vaginal laceration 10/19/2020   Encounter for induction of labor 10/18/2020   COVID-19 virus infection 09/15/2020   Pregnant 09/15/2020   Tachycardia 08/27/2020   Family history of gestational diabetes mellitus (GDM) in mother 03/20/2020   Type A blood, Rh positive 03/20/2020   Menstrual changes 02/05/2020   Syncope 01/19/2020   Hemorrhoids 05/19/2019   Constipation 05/19/2019   Menorrhagia with regular cycle 10/02/2017    Mariane Masters ,PT, DPT, E-RYT  03/04/2021, 4:12 PM  [facility] MAIN [facility] SERVICES [address]  [place], [place], [address] Phone: [phone]   Fax:  [phone]  Name: Linda Mendez MRN: [id number] Date of Birth: 1996-06-15

## 2021-03-08 ENCOUNTER — Ambulatory Visit: Payer: BC Managed Care – PPO | Admitting: Physical Therapy

## 2021-03-16 ENCOUNTER — Encounter: Payer: BC Managed Care – PPO | Admitting: Physical Therapy

## 2021-03-18 ENCOUNTER — Encounter: Payer: BC Managed Care – PPO | Admitting: Physical Therapy

## 2021-03-18 ENCOUNTER — Ambulatory Visit: Payer: BC Managed Care – PPO | Attending: Certified Nurse Midwife | Admitting: Physical Therapy

## 2021-03-18 ENCOUNTER — Other Ambulatory Visit: Payer: Self-pay

## 2021-03-18 DIAGNOSIS — M6208 Separation of muscle (nontraumatic), other site: Secondary | ICD-10-CM | POA: Diagnosis present

## 2021-03-18 DIAGNOSIS — M533 Sacrococcygeal disorders, not elsewhere classified: Secondary | ICD-10-CM | POA: Diagnosis present

## 2021-03-18 DIAGNOSIS — R2689 Other abnormalities of gait and mobility: Secondary | ICD-10-CM | POA: Diagnosis present

## 2021-03-18 NOTE — Therapy (Signed)
[place] [facility] MAIN [facility] SERVICES [address] [place], [place], [address] Phone: [phone]   Fax:  [phone]  Physical Therapy Treatment  Patient Details  Name: Linda Mendez MRN: [id number] Date of Birth: Jan 24, 1996 Referring Provider (PT): Jeralyn Bennett   Encounter Date: 03/18/2021   PT End of Session - 03/18/21 1519     Visit Number 7    Number of Visits 10    Date for PT Re-Evaluation 05/27/21    PT Start Time 1513    PT Stop Time 1615    PT Time Calculation (min) 62 min    Activity Tolerance Patient tolerated treatment well    Behavior During Therapy [facility] for tasks assessed/performed             Past Medical History:  Diagnosis Date   Arm fracture, left    Hemorrhoids    Mononucleosis     Past Surgical History:  Procedure Laterality Date   arm surgery     blocked tear duct surgery     FRACTURE SURGERY Left 2003   Left arm     There were no vitals filed for this visit.   Subjective Assessment - 03/18/21 1653     Subjective Pt had question about bird dog exercise    Patient Stated Goals Continue with postpartum workout " Expecting and Empowered" workout, decrease pain with bowel movements, improve separation in abdomen                [facility] PT Assessment - 03/18/21 1539       Single Leg Stance   Comments R ankle instability , L less      Strength   Overall Strength Comments R hip abd 3/5, L 5/5 ( post Tx at obt int R: R 4-/5 )                        Pelvic Floor Special Questions - 03/18/21 1610     Pelvic Floor Internal Exam pt consented verbally and had no contraindications    Exam Type Vaginal    Palpation tightness at obt int / ATLA 9 o'clock , less tendermness, spotting noted on glove   spotting of blood on therapist glove post Tx              [facility] Adult PT Treatment/Exercise - 03/18/21 1610       Neuro Re-ed    Neuro Re-ed Details  cued for less elbow hyperextension, lumbar  lordosis, multifidis form      Modalities   Modalities Moist Heat      Moist Heat Therapy   Moist Heat Location Other (comment)   perineum     Manual Therapy   Internal Pelvic Floor STM/MWM at areas noted in assessment                         PT Long Term Goals - 03/04/21 1553       PT LONG TERM GOAL #1   Title Pt will decrease FOTO lumbar score from 70 pts to < 50 pts in order to promote ADLs    Time 10    Period Weeks    Status On-going      PT LONG TERM GOAL #2   Title Pt will decreased FOTO score for Prolapse from 21 pts to < 18 pts and Pelvic Pain from 25 pts to < 22 pts in order to improve QOL  Time 10    Period Weeks    Status On-going      PT LONG TERM GOAL #3   Title Pt will demo less abdominal separation from 3 fingers width to < 2 fingers width, improved deep core coordination and strength in order to to optimize IAP  system to minimize pain and prolapse ( 6/30: 1 finger)    Time 4    Period Weeks    Status Achieved      PT LONG TERM GOAL #4   Title Pt will demo equal pelvic girdle alignment and increased R hip abduction strength from 4-/5 to > 5/5 in order to progress to deep core / pelvic floor  exercises and minmize pain ( 01/15/21" 5/5 B)    Time 2    Period Weeks    Status Achieved      PT LONG TERM GOAL #5   Title Pt will demo proper lengthening and contraction of pelvic floor without compensatory patterns and no cues in order to minimize pain with sex, OB/GYN exams, and have BMs without pain    Time 76    Period Weeks    Status On-going                   Plan - 03/18/21 1617     Clinical Impression Statement Pt showed improved cervical endurance and lumbar strength and will be ready for more fitness training in preparation for her race in Oct.   Pt required cues for less hyperextended elbows in quadruped.Pt also showed weakness in R hip abduction which improved after internal Tx to minimize tensions at obturator internus.  Plan to train lower kinetic chain at upcoming sessions. Initiated multidifis strengthening today and pt required minor cues. Pt continues to benefit from skilled PT    Examination-Activity Limitations Toileting;Lift;Transfers;Bed Mobility;Bend;Carry    Stability/Clinical Decision Making Evolving/Moderate complexity    Rehab Potential Good    PT Frequency 1x / week    PT Duration Other (comment)   10   PT Treatment/Interventions Functional mobility training;Stair training;Neuromuscular re-education;Patient/family education;Therapeutic activities;Moist Heat;Traction;Biofeedback;Cryotherapy;Scar mobilization;Manual techniques;Energy conservation;Manual lymph drainage;Taping;Splinting;Compression bandaging;Therapeutic exercise;Joint Manipulations    Consulted and Agree with Plan of Care Patient             Patient will benefit from skilled therapeutic intervention in order to improve the following deficits and impairments:  Postural dysfunction, Increased muscle spasms, Hypermobility, Decreased scar mobility, Decreased range of motion, Decreased endurance, Decreased activity tolerance, Difficulty walking, Decreased balance, Improper body mechanics, Pain, Increased fascial restricitons, Decreased strength, Hypomobility, Decreased safety awareness, Decreased mobility, Decreased coordination  Visit Diagnosis: Sacrococcygeal disorders, not elsewhere classified  Diastasis recti  Other abnormalities of gait and mobility     Problem List Patient Active Problem List   Diagnosis Date Noted   Diastasis recti 12/08/2020   Vacuum-assisted vaginal delivery 10/19/2020   Second degree perineal laceration 10/19/2020   Vaginal laceration 10/19/2020   Encounter for induction of labor 10/18/2020   COVID-19 virus infection 09/15/2020   Pregnant 09/15/2020   Tachycardia 08/27/2020   Family history of gestational diabetes mellitus (GDM) in mother 03/20/2020   Type A blood, Rh positive 03/20/2020    Menstrual changes 02/05/2020   Syncope 01/19/2020   Hemorrhoids 05/19/2019   Constipation 05/19/2019   Menorrhagia with regular cycle 10/02/2017    Mariane Masters ,PT, DPT, E-RYT  03/18/2021, 4:53 PM  [place] [facility] MAIN [facility] SERVICES [address] [place], [place], [address] Phone: [phone]  Fax:  [phone]  Name: Valora Norell MRN: [id number] Date of Birth: 07-26-1996

## 2021-03-18 NOTE — Patient Instructions (Signed)
Bird dog with less elbow locking with imaginary pencils at armpits Plate on back    __  Locust/ low cobra with pressure at pubic bone and tops of feet  Palms face hips , active fingers spread  ___  Multifidis twist  Band is on doorknob: stand further away from door (facing perpendicular)   Twisting trunk without moving the hips and knees Hold band at the level of ribcage, elbows bent,shoulder blades roll back and down like squeezing a pencil under armpit    Exhale twist,.10-15 deg away from door without moving your hips/ knees. Continue to maintain equal weight through legs. Keep knee unlocked.  30   __   Clam Shell 45 Degrees  Lying with hips and knees bent 45, one pillow between knees and ankles. Heel together, toes apart like ballerina,  Lift knee with exhale while pressing heels together. Be sure pelvis does not roll backward. Do not arch back. Do 20 times, R  only this week  2 times per day.     Complimentary stretch: Figure-4   3 breaths  * Keep pelvis levelled with tactile cue with hand under back of hips  * Slide the ankle of the supporting foot out to decrease the angle which can help level the pelvis

## 2021-03-23 ENCOUNTER — Encounter: Payer: BC Managed Care – PPO | Admitting: Physical Therapy

## 2021-03-25 ENCOUNTER — Encounter: Payer: BC Managed Care – PPO | Admitting: Physical Therapy

## 2021-03-25 ENCOUNTER — Ambulatory Visit: Payer: BC Managed Care – PPO | Admitting: Physical Therapy

## 2021-04-01 ENCOUNTER — Ambulatory Visit: Payer: BC Managed Care – PPO | Admitting: Certified Nurse Midwife

## 2021-04-01 ENCOUNTER — Encounter: Payer: BC Managed Care – PPO | Admitting: Physical Therapy

## 2021-04-01 ENCOUNTER — Other Ambulatory Visit (HOSPITAL_COMMUNITY)
Admission: RE | Admit: 2021-04-01 | Discharge: 2021-04-01 | Disposition: A | Discharge status: Home / Self Care | Payer: BC Managed Care – PPO | Source: Ambulatory Visit | Attending: Certified Nurse Midwife | Admitting: Certified Nurse Midwife

## 2021-04-01 ENCOUNTER — Ambulatory Visit: Payer: BC Managed Care – PPO | Admitting: Physical Therapy

## 2021-04-01 ENCOUNTER — Other Ambulatory Visit: Payer: Self-pay

## 2021-04-01 DIAGNOSIS — N898 Other specified noninflammatory disorders of vagina: Secondary | ICD-10-CM | POA: Diagnosis present

## 2021-04-01 DIAGNOSIS — M533 Sacrococcygeal disorders, not elsewhere classified: Secondary | ICD-10-CM

## 2021-04-01 DIAGNOSIS — M6208 Separation of muscle (nontraumatic), other site: Secondary | ICD-10-CM

## 2021-04-01 DIAGNOSIS — R2689 Other abnormalities of gait and mobility: Secondary | ICD-10-CM

## 2021-04-01 NOTE — Patient Instructions (Addendum)
   WALKING WITH RESISTANCE BLUE Band at waist connected to doorknob Stepping forward normal length steps, planting mid and forefoot down, center of mass ( navel) leans forward slightly as if you were walking uphill 3-4 steps till band feels taut ( MAKE SURE THE DOOR IS LOCKED AND WON'T OPEN)   Stepping backwards, lower heel slowly, carry trunk and hips back , leaning forward, front knee along 2-3 rd toe line    2 min   __   Letter T, seeasaw on one leg with band band under L foot, wrap by big toe then, outer knee/ thigh, L hand pulling ( elbow by side)  Plant ball[place]ound, toes spread, thigh out against band,, tracking knee in line with 2-3rd toe line Dipping forward, R foot lifts slight ( whole body like a see saw) off floor or  Press back foot against wall R for now  30 reps

## 2021-04-01 NOTE — Therapy (Signed)
[place] [facility] MAIN [facility] SERVICES [address] [place], [place], [address] Phone: [phone]   Fax:  [phone]  Physical Therapy Treatment  Patient Details  Name: Linda Mendez MRN: [id number] Date of Birth: 1995/11/23 Referring Provider (PT): Jeralyn Bennett   Encounter Date: 04/01/2021   PT End of Session - 04/01/21 1757     Visit Number 8    Number of Visits 10    Date for PT Re-Evaluation 05/27/21    PT Start Time 1503    PT Stop Time 1600    PT Time Calculation (min) 57 min    Activity Tolerance Patient tolerated treatment well    Behavior During Therapy [facility] for tasks assessed/performed             Past Medical History:  Diagnosis Date   Arm fracture, left    Hemorrhoids    Mononucleosis     Past Surgical History:  Procedure Laterality Date   arm surgery     blocked tear duct surgery     FRACTURE SURGERY Left 2003   Left arm     There were no vitals filed for this visit.   Subjective Assessment - 04/01/21 1510     Subjective Pt has not had any lowered feeling of prolapse nor leakage over the last 2 weeks.  Pt got new running shoes    Patient Stated Goals Continue with postpartum workout " Expecting and Empowered" workout, decrease pain with bowel movements, improve separation in abdomen                [facility] PT Assessment - 04/01/21 1542       Strength   Overall Strength Comments R hip abd 4+/5      Ambulation/Gait   Gait Comments running analysis on treadmill: heel strike                        Pelvic Floor Special Questions - 04/01/21 1542     Pelvic Floor Internal Exam pt consented verbally and had no contraindications    Exam Type Vaginal    Palpation tightness at L hip abductors internally   spotting of blood on therapist glove post Tx                           PT Long Term Goals - 03/04/21 1553       PT LONG TERM GOAL #1   Title Pt will decrease FOTO lumbar score  from 70 pts to < 50 pts in order to promote ADLs    Time 10    Period Weeks    Status On-going      PT LONG TERM GOAL #2   Title Pt will decreased FOTO score for Prolapse from 21 pts to < 18 pts and Pelvic Pain from 25 pts to < 22 pts in order to improve QOL    Time 10    Period Weeks    Status On-going      PT LONG TERM GOAL #3   Title Pt will demo less abdominal separation from 3 fingers width to < 2 fingers width, improved deep core coordination and strength in order to to optimize IAP  system to minimize pain and prolapse ( 6/30: 1 finger)    Time 4    Period Weeks    Status Achieved      PT LONG TERM GOAL #4   Title Pt  will demo equal pelvic girdle alignment and increased R hip abduction strength from 4-/5 to > 5/5 in order to progress to deep core / pelvic floor  exercises and minmize pain ( 01/15/21" 5/5 B)    Time 2    Period Weeks    Status Achieved      PT LONG TERM GOAL #5   Title Pt will demo proper lengthening and contraction of pelvic floor without compensatory patterns and no cues in order to minimize pain with sex, OB/GYN exams, and have BMs without pain    Time 76    Period Weeks    Status On-going                   Plan - 04/01/21 1758     Clinical Impression Statement Pt demo'd significantly less pelvic floor tensions and is ready for running. Running analysis on treadmill showed heel striking and limited hip flexion, push off. Pt demo'd improved post training. Initiated R lower kinetic chain chain strengthening and propioception training as RLE showed genu valgus. Pt continues to benefit from skilled PT      Examination-Activity Limitations Toileting;Lift;Transfers;Bed Mobility;Bend;Carry    Stability/Clinical Decision Making Evolving/Moderate complexity    Rehab Potential Good    PT Frequency 1x / week    PT Duration Other (comment)   10   PT Treatment/Interventions Functional mobility training;Stair training;Neuromuscular re-education;Patient/family  education;Therapeutic activities;Moist Heat;Traction;Biofeedback;Cryotherapy;Scar mobilization;Manual techniques;Energy conservation;Manual lymph drainage;Taping;Splinting;Compression bandaging;Therapeutic exercise;Joint Manipulations    Consulted and Agree with Plan of Care Patient             Patient will benefit from skilled therapeutic intervention in order to improve the following deficits and impairments:  Postural dysfunction, Increased muscle spasms, Hypermobility, Decreased scar mobility, Decreased range of motion, Decreased endurance, Decreased activity tolerance, Difficulty walking, Decreased balance, Improper body mechanics, Pain, Increased fascial restricitons, Decreased strength, Hypomobility, Decreased safety awareness, Decreased mobility, Decreased coordination  Visit Diagnosis: Sacrococcygeal disorders, not elsewhere classified  Diastasis recti  Other abnormalities of gait and mobility     Problem List Patient Active Problem List   Diagnosis Date Noted   Diastasis recti 12/08/2020   Vacuum-assisted vaginal delivery 10/19/2020   Second degree perineal laceration 10/19/2020   Vaginal laceration 10/19/2020   Encounter for induction of labor 10/18/2020   COVID-19 virus infection 09/15/2020   Pregnant 09/15/2020   Tachycardia 08/27/2020   Family history of gestational diabetes mellitus (GDM) in mother 03/20/2020   Type A blood, Rh positive 03/20/2020   Menstrual changes 02/05/2020   Syncope 01/19/2020   Hemorrhoids 05/19/2019   Constipation 05/19/2019   Menorrhagia with regular cycle 10/02/2017    Mariane Masters ,PT, DPT, E-RYT  04/01/2021, 6:20 PM  [facility] MAIN [facility] SERVICES [address] [place], [place], [address] Phone: [phone]   Fax:  [phone]  Name: Linda Mendez MRN: [id number] Date of Birth: 03/10/1996

## 2021-04-05 LAB — CERVICOVAGINAL ANCILLARY ONLY
Bacterial Vaginitis (gardnerella): NEGATIVE
Candida Glabrata: NEGATIVE
Candida Vaginitis: NEGATIVE
Comment: NEGATIVE
Comment: NEGATIVE
Comment: NEGATIVE

## 2021-04-08 ENCOUNTER — Ambulatory Visit: Payer: BC Managed Care – PPO | Admitting: Physical Therapy

## 2021-04-08 ENCOUNTER — Encounter: Payer: BC Managed Care – PPO | Admitting: Physical Therapy

## 2021-05-14 ENCOUNTER — Encounter: Payer: BC Managed Care – PPO | Admitting: Certified Nurse Midwife

## 2021-08-26 ENCOUNTER — Telehealth: Payer: Self-pay | Admitting: Physician Assistant

## 2021-08-26 DIAGNOSIS — J019 Acute sinusitis, unspecified: Secondary | ICD-10-CM

## 2021-08-26 DIAGNOSIS — B9789 Other viral agents as the cause of diseases classified elsewhere: Secondary | ICD-10-CM

## 2021-08-27 MED ORDER — IPRATROPIUM BROMIDE 0.03 % NA SOLN: 2.0000 | Freq: Two times a day (BID) | NASAL | 0 refills | Status: DC

## 2021-08-27 NOTE — Progress Notes (Signed)

## 2021-11-23 ENCOUNTER — Ambulatory Visit: Payer: Self-pay | Admitting: Internal Medicine

## 2021-11-26 ENCOUNTER — Encounter: Payer: Self-pay | Admitting: Internal Medicine

## 2021-11-26 ENCOUNTER — Ambulatory Visit (INDEPENDENT_AMBULATORY_CARE_PROVIDER_SITE_OTHER): Payer: BC Managed Care – PPO | Admitting: Internal Medicine

## 2021-11-26 VITALS — BP 110/60 | HR 69 | Temp 98.0°F | Resp 12 | Ht 63.0 in | Wt 120.0 lb

## 2021-11-26 DIAGNOSIS — R55 Syncope and collapse: Secondary | ICD-10-CM

## 2021-11-26 DIAGNOSIS — R5383 Other fatigue: Secondary | ICD-10-CM

## 2021-11-26 DIAGNOSIS — R000 Tachycardia, unspecified: Secondary | ICD-10-CM

## 2021-11-26 DIAGNOSIS — K649 Unspecified hemorrhoids: Secondary | ICD-10-CM

## 2021-11-26 DIAGNOSIS — D649 Anemia, unspecified: Secondary | ICD-10-CM

## 2021-11-26 DIAGNOSIS — E611 Iron deficiency: Secondary | ICD-10-CM | POA: Diagnosis not present

## 2021-11-26 NOTE — Progress Notes (Signed)
? ?Subjective:  ? ? Patient ID: Linda Mendez, female    DOB: 19-Jan-1996, 26 y.o.   MRN: [id number] ? ?This visit occurred during the SARS-CoV-2 public health emergency.  Safety protocols were in place, including screening questions prior to the visit, additional usage of staff PPE, and extensive cleaning of exam room while observing appropriate contact time as indicated for disinfecting solutions.  ? ?Patient here for a scheduled physical.  ? ?Chief Complaint  ?Patient presents with  ? Annual Exam  ?  CPE - pt doing well. Planning second child with partner.  ?  ? ?HPI ?Was here for a physical.  GYN does her breast and pelvic exams.  Changed appt to follow up appt.  Gave birth - first child - 10/2020.  Suffered second degree perineal laceration and vaginal laceration - repaired.  Has continued to have some issues.  Has been to PT - pelvic floor therapy.  Also having problems with hemorrhoids.  Persistent - intermittent issue.  Bowels are moving.  No straining.  Some intermittent bleeding.  Wants to have another child.  Discussed evaluation for hemorrhoids.  Would like to have evaluated prior to getting pregnant again.  No chest pain reported.  Breathing stable.  Eating.   ? ? ?Past Medical History:  ?Diagnosis Date  ? Arm fracture, left   ? Hemorrhoids   ? Mononucleosis   ? ?Past Surgical History:  ?Procedure Laterality Date  ? arm surgery    ? blocked tear duct surgery    ? FRACTURE SURGERY Left 2003  ? Left arm   ? ?Family History  ?Problem Relation Age of Onset  ? Thyroid disease Mother   ? Migraines Mother   ? Hypersomnolence Mother   ? Hyperlipidemia Father   ? Hyperlipidemia Paternal Grandfather   ? Skin cancer Maternal Grandmother   ? Skin cancer Paternal Grandmother   ? ?Social History  ? ?Socioeconomic History  ? Marital status: Married  ?  Spouse name: Not on file  ? Number of children: Not on file  ? Years of education: Not on file  ? Highest education level: Not on file  ?Occupational History  ? Not on  file  ?Tobacco Use  ? Smoking status: Never  ? Smokeless tobacco: Never  ?Vaping Use  ? Vaping Use: Never used  ?Substance and Sexual Activity  ? Alcohol use: Not Currently  ?  Comment: Rarely drank before pregnancy.  ? Drug use: No  ? Sexual activity: Yes  ?  Partners: Male  ?  Birth control/protection: None  ?Other Topics Concern  ? Not on file  ?Social History Narrative  ? Not on file  ? ?Social Determinants of Health  ? ?Financial Resource Strain: Not on file  ?Food Insecurity: Not on file  ?Transportation Needs: Not on file  ?Physical Activity: Not on file  ?Stress: Not on file  ?Social Connections: Not on file  ? ? ? ?Review of Systems  ?Constitutional:  Negative for appetite change and unexpected weight change.  ?HENT:  Negative for congestion, sinus pressure and sore throat.   ?Eyes:  Negative for pain and visual disturbance.  ?Respiratory:  Negative for cough, chest tightness and shortness of breath.   ?Cardiovascular:  Negative for chest pain, palpitations and leg swelling.  ?Gastrointestinal:  Negative for abdominal pain, diarrhea, nausea and vomiting.  ?Genitourinary:  Negative for difficulty urinating and dysuria.  ?Musculoskeletal:  Negative for joint swelling and myalgias.  ?Skin:  Negative for color change and rash.  ?  Neurological:  Negative for dizziness, light-headedness and headaches.  ?Hematological:  Negative for adenopathy. Does not bruise/bleed easily.  ?Psychiatric/Behavioral:  Negative for agitation and dysphoric mood.   ? ?   ?Objective:  ?  ? ?BP 110/60 (BP Location: Left Arm, Patient Position: Sitting, Cuff Size: Small)   Pulse 69   Temp 98 ?F (36.7 ?C) (Temporal)   Resp 12   Ht 5\' 3"  (1.6 m)   Wt 120 lb (54.4 kg)   SpO2 100%   BMI 21.26 kg/m?  ?Wt Readings from Last 3 Encounters:  ?11/26/21 120 lb (54.4 kg)  ?12/07/20 124 lb 6.4 oz (56.4 kg)  ?10/18/20 148 lb (67.1 kg)  ? ? ?Physical Exam ?Vitals reviewed.  ?Constitutional:   ?   General: She is not in acute distress. ?    Appearance: Normal appearance.  ?HENT:  ?   Head: Normocephalic and atraumatic.  ?   Right Ear: External ear normal.  ?   Left Ear: External ear normal.  ?Eyes:  ?   General: No scleral icterus.    ?   Right eye: No discharge.     ?   Left eye: No discharge.  ?   Conjunctiva/sclera: Conjunctivae normal.  ?Neck:  ?   Thyroid: No thyromegaly.  ?Cardiovascular:  ?   Rate and Rhythm: Normal rate and regular rhythm.  ?Pulmonary:  ?   Effort: No respiratory distress.  ?   Breath sounds: Normal breath sounds. No wheezing.  ?Abdominal:  ?   General: Bowel sounds are normal.  ?   Palpations: Abdomen is soft.  ?   Tenderness: There is no abdominal tenderness.  ?Genitourinary: ?   Comments: Declined rectal exam - not flared now.  ?Musculoskeletal:     ?   General: No swelling or tenderness.  ?   Cervical back: Neck supple. No tenderness.  ?Lymphadenopathy:  ?   Cervical: No cervical adenopathy.  ?Skin: ?   Findings: No erythema or rash.  ?Neurological:  ?   Mental Status: She is alert.  ?Psychiatric:     ?   Mood and Affect: Mood normal.     ?   Behavior: Behavior normal.  ? ? ? ?Outpatient Encounter Medications as of 11/26/2021  ?Medication Sig  ? hydrocortisone (ANUSOL-HC) 25 MG suppository Place 1 suppository (25 mg total) rectally 2 (two) times daily.  ? ibuprofen (ADVIL) 600 MG tablet Take 1 tablet (600 mg total) by mouth every 6 (six) hours.  ? ipratropium (ATROVENT) 0.03 % nasal spray Place 2 sprays into both nostrils every 12 (twelve) hours.  ? Misc. Devices (BREAST PUMP) MISC Dispense one breast pump for patient  ? [DISCONTINUED] Prenatal Vit-Fe Fumarate-FA (PRENATAL VITAMIN PO) Take by mouth daily. (Patient not taking: Reported on 11/26/2021)  ? [DISCONTINUED] witch hazel-glycerin (TUCKS) pad Apply 1 application topically as needed for hemorrhoids. (Patient not taking: Reported on 11/26/2021)  ? ?No facility-administered encounter medications on file as of 11/26/2021.  ?  ? ?Lab Results  ?Component Value Date  ? WBC 5.4  11/26/2021  ? HGB 12.7 11/26/2021  ? HCT 38.9 11/26/2021  ? PLT 244 11/26/2021  ? GLUCOSE 85 11/26/2021  ? CHOL 134 09/12/2017  ? TRIG 49 09/12/2017  ? HDL 55 09/12/2017  ? [place] 69 09/12/2017  ? ALT 21 09/12/2017  ? AST 22 09/12/2017  ? NA 139 11/26/2021  ? K 4.0 11/26/2021  ? CL 104 11/26/2021  ? CREATININE 0.64 11/26/2021  ? BUN 18 11/26/2021  ?  CO2 21 11/26/2021  ? TSH 1.98 11/26/2021  ? HGBA1C 5.0 03/20/2020  ? ? ?   ?Assessment & Plan:  ? ?Problem List Items Addressed This Visit   ? ? Anemia  ?  Check cbc and iron stores.  ? ?  ?  ? Hemorrhoids  ?  Hemorrhoids as outlined.  Discussed.  Will send in rx for anusol HC suppositories.  Will notify to hold on using if concern regarding pregnancy/breast feeding. Refer to surgery for evaluation.  ? ?  ?  ? Relevant Orders  ? Ambulatory referral to General Surgery  ? Second degree perineal laceration  ?  Continue  PT.  Follow.  ? ?  ?  ? Syncope - Primary  ? Tachycardia  ? Vaginal laceration  ?  Has been evaluated by gyn.  Has been to PT - pelvic floor therapy.   ? ?  ?  ? ?Other Visit Diagnoses   ? ? Other fatigue      ? Relevant Orders  ? CBC w/Diff (Completed)  ? TSH (Completed)  ? BASIC METABOLIC PANEL WITH GFR (Completed)  ? Iron deficiency      ? Relevant Orders  ? Ferritin (Completed)  ? ?  ? ? ? ?Einar Pheasant, MD  ?

## 2021-11-27 ENCOUNTER — Encounter: Payer: Self-pay | Admitting: Internal Medicine

## 2021-11-27 DIAGNOSIS — D649 Anemia, unspecified: Secondary | ICD-10-CM | POA: Insufficient documentation

## 2021-11-27 LAB — BASIC METABOLIC PANEL WITH GFR
BUN: 18 mg/dL (ref 7–25)
CO2: 21 mmol/L (ref 20–32)
Calcium: 9.4 mg/dL (ref 8.6–10.2)
Chloride: 104 mmol/L (ref 98–110)
Creat: 0.64 mg/dL (ref 0.50–0.96)
Glucose, Bld: 85 mg/dL (ref 65–99)
Potassium: 4 mmol/L (ref 3.5–5.3)
Sodium: 139 mmol/L (ref 135–146)
eGFR: 124 mL/min/{1.73_m2} (ref >=60)

## 2021-11-27 LAB — TSH: TSH: 1.98 mIU/L

## 2021-11-27 LAB — CBC WITH DIFFERENTIAL/PLATELET
Absolute Monocytes: 475 cells/uL (ref 200–950)
Basophils Absolute: 49 cells/uL (ref 0–200)
Basophils Relative: 0.9 %
Eosinophils Absolute: 70 cells/uL (ref 15–500)
Eosinophils Relative: 1.3 %
HCT: 38.9 % (ref 35.0–45.0)
Hemoglobin: 12.7 g/dL (ref 11.7–15.5)
Lymphs Abs: 2543 cells/uL (ref 850–3900)
MCH: 30 pg (ref 27.0–33.0)
MCHC: 32.6 g/dL (ref 32.0–36.0)
MCV: 92 fL (ref 80.0–100.0)
MPV: 10.4 fL (ref 7.5–12.5)
Monocytes Relative: 8.8 %
Neutro Abs: 2263 cells/uL (ref 1500–7800)
Neutrophils Relative %: 41.9 %
Platelets: 244 10*3/uL (ref 140–400)
RBC: 4.23 10*6/uL (ref 3.80–5.10)
RDW: 12.3 % (ref 11.0–15.0)
Total Lymphocyte: 47.1 %
WBC: 5.4 10*3/uL (ref 3.8–10.8)

## 2021-11-27 LAB — FERRITIN: Ferritin: 7 ng/mL — ABNORMAL LOW (ref 16–154)

## 2021-11-27 MED ORDER — HYDROCORTISONE ACETATE 25 MG RE SUPP: 25.0000 mg | Freq: Two times a day (BID) | RECTAL | 0 refills | Status: DC

## 2021-11-27 NOTE — Assessment & Plan Note (Signed)
Continue PT.  Follow.  ?

## 2021-11-27 NOTE — Assessment & Plan Note (Signed)
Check cbc and iron stores.   

## 2021-11-27 NOTE — Assessment & Plan Note (Signed)
Hemorrhoids as outlined.  Discussed.  Will send in rx for anusol HC suppositories.  Will notify to hold on using if concern regarding pregnancy/breast feeding. Refer to surgery for evaluation.  ?

## 2021-11-27 NOTE — Assessment & Plan Note (Signed)
Has been evaluated by gyn.  Has been to PT - pelvic floor therapy.   ?

## 2021-11-29 ENCOUNTER — Telehealth: Payer: Self-pay | Admitting: *Deleted

## 2021-11-29 NOTE — Telephone Encounter (Signed)
Left message to call office. Can advise of below. ?

## 2021-11-29 NOTE — Telephone Encounter (Signed)
-----   Message from Dale Ford, MD sent at 11/28/2021 12:58 PM EDT ----- ?Notify - hgb is wnl.  Iron stores are low.  Confirm she is not taking any iron supplements.  Would like to start her on iron.  If able, can take over the counter iron, then ferrous sulfate 325mg  q day.  If problems with over the counter iron, send in rx for integra one per day.  (I can send in rx if agreeable).  Recheck cbc and ferritin in 4-6 weeks.  Thyroid test and kidney function tests are wnl.  ?

## 2021-12-03 NOTE — Addendum Note (Signed)
Addended by: Nanci Pina on: 12/03/2021 11:18 AM ? ? Modules accepted: Orders ? ?

## 2021-12-06 ENCOUNTER — Encounter: Payer: Self-pay | Admitting: Internal Medicine

## 2021-12-07 NOTE — Telephone Encounter (Signed)
Given her concerns regarding the dizziness, etc - please schedule an appointment for further evaluation and we can discuss her other concerns as well.  ?

## 2021-12-08 NOTE — Telephone Encounter (Signed)
LMTCB to see if I could get patient scheduled with someone today. Possibly Dr.Tracy since she has openings this afternoon.  ?

## 2022-01-03 ENCOUNTER — Other Ambulatory Visit (INDEPENDENT_AMBULATORY_CARE_PROVIDER_SITE_OTHER): Payer: BC Managed Care – PPO

## 2022-01-03 DIAGNOSIS — E611 Iron deficiency: Secondary | ICD-10-CM | POA: Diagnosis not present

## 2022-01-04 LAB — CBC WITH DIFFERENTIAL/PLATELET
Basophils Absolute: 0.1 10*3/uL (ref 0.0–0.1)
Basophils Relative: 1.2 % (ref 0.0–3.0)
Eosinophils Absolute: 0.1 10*3/uL (ref 0.0–0.7)
Eosinophils Relative: 1.1 % (ref 0.0–5.0)
HCT: 34.4 % — ABNORMAL LOW (ref 36.0–46.0)
Hemoglobin: 11.6 g/dL — ABNORMAL LOW (ref 12.0–15.0)
Lymphocytes Relative: 48.8 % — ABNORMAL HIGH (ref 12.0–46.0)
Lymphs Abs: 2.4 10*3/uL (ref 0.7–4.0)
MCHC: 33.9 g/dL (ref 30.0–36.0)
MCV: 91.8 fl (ref 78.0–100.0)
Monocytes Absolute: 0.4 10*3/uL (ref 0.1–1.0)
Monocytes Relative: 8.7 % (ref 3.0–12.0)
Neutro Abs: 2 10*3/uL (ref 1.4–7.7)
Neutrophils Relative %: 40.2 % — ABNORMAL LOW (ref 43.0–77.0)
Platelets: 219 10*3/uL (ref 150.0–400.0)
RBC: 3.74 Mil/uL — ABNORMAL LOW (ref 3.87–5.11)
RDW: 13.5 % (ref 11.5–15.5)
WBC: 4.9 10*3/uL (ref 4.0–10.5)

## 2022-01-04 LAB — FERRITIN: Ferritin: 11.7 ng/mL (ref 10.0–291.0)

## 2022-01-05 ENCOUNTER — Other Ambulatory Visit: Payer: BC Managed Care – PPO

## 2022-01-05 ENCOUNTER — Telehealth: Payer: Self-pay | Admitting: Internal Medicine

## 2022-01-05 NOTE — Telephone Encounter (Signed)
Pt returning call for test results. °

## 2022-01-05 NOTE — Telephone Encounter (Signed)
Pt s/w sarah

## 2022-01-06 ENCOUNTER — Other Ambulatory Visit: Payer: Self-pay

## 2022-01-06 ENCOUNTER — Encounter: Payer: Self-pay | Admitting: Internal Medicine

## 2022-01-06 DIAGNOSIS — E611 Iron deficiency: Secondary | ICD-10-CM

## 2022-01-07 NOTE — Telephone Encounter (Signed)
Called and spoke to [place].  Discussed her blood counts and ferritin.  She will remain on one per day.  Recheck labs as planned.

## 2022-02-23 ENCOUNTER — Other Ambulatory Visit: Payer: Self-pay

## 2022-02-23 ENCOUNTER — Telehealth: Payer: Self-pay | Admitting: Internal Medicine

## 2022-02-23 DIAGNOSIS — E611 Iron deficiency: Secondary | ICD-10-CM

## 2022-02-23 NOTE — Telephone Encounter (Signed)
placed

## 2022-02-23 NOTE — Telephone Encounter (Signed)
Patient has a lab appt 03/03/2022, there are no orders in. 

## 2022-03-03 ENCOUNTER — Other Ambulatory Visit (INDEPENDENT_AMBULATORY_CARE_PROVIDER_SITE_OTHER): Payer: BC Managed Care – PPO

## 2022-03-03 DIAGNOSIS — E611 Iron deficiency: Secondary | ICD-10-CM | POA: Diagnosis not present

## 2022-03-03 LAB — IBC + FERRITIN
Ferritin: 16 ng/mL (ref 10.0–291.0)
Iron: 118 ug/dL (ref 42–145)
Saturation Ratios: 34.4 % (ref 20.0–50.0)
TIBC: 343 ug/dL (ref 250.0–450.0)
Transferrin: 245 mg/dL (ref 212.0–360.0)

## 2022-03-03 LAB — CBC WITH DIFFERENTIAL/PLATELET
Basophils Absolute: 0 10*3/uL (ref 0.0–0.1)
Basophils Relative: 1.1 % (ref 0.0–3.0)
Eosinophils Absolute: 0 10*3/uL (ref 0.0–0.7)
Eosinophils Relative: 0.9 % (ref 0.0–5.0)
HCT: 35.4 % — ABNORMAL LOW (ref 36.0–46.0)
Hemoglobin: 12.1 g/dL (ref 12.0–15.0)
Lymphocytes Relative: 42.9 % (ref 12.0–46.0)
Lymphs Abs: 1.7 10*3/uL (ref 0.7–4.0)
MCHC: 34.1 g/dL (ref 30.0–36.0)
MCV: 91.4 fl (ref 78.0–100.0)
Monocytes Absolute: 0.4 10*3/uL (ref 0.1–1.0)
Monocytes Relative: 10.7 % (ref 3.0–12.0)
Neutro Abs: 1.8 10*3/uL (ref 1.4–7.7)
Neutrophils Relative %: 44.4 % (ref 43.0–77.0)
Platelets: 232 10*3/uL (ref 150.0–400.0)
RBC: 3.87 Mil/uL (ref 3.87–5.11)
RDW: 12.3 % (ref 11.5–15.5)
WBC: 4 10*3/uL (ref 4.0–10.5)

## 2022-03-04 ENCOUNTER — Encounter: Payer: Self-pay | Admitting: Internal Medicine

## 2022-03-04 NOTE — Telephone Encounter (Signed)
Please call and notify pt that I do want her to continue taking the iron supplement, but if taking bid, can decrease to q day.  Would like to recheck cbc and ferritin (only) in 4 months to confirm stable/normal

## 2022-03-08 NOTE — Telephone Encounter (Signed)
Lm for pt to cb - mychart msg sent

## 2022-03-25 NOTE — Telephone Encounter (Signed)
Prenatal vitamin with iron should be sufficient. She does not need to take both ferrous sulfate and prenatal vitamin with iron.

## 2022-05-03 ENCOUNTER — Telehealth: Payer: Self-pay

## 2022-05-03 NOTE — Telephone Encounter (Signed)
If this has been going on for two days and is pregnant, I would recommend evaluation.

## 2022-05-03 NOTE — Telephone Encounter (Signed)
Just nausea for 24 hours with diarrhea, patient says she has 6 stools since yesterday very watery, keeping fluids down patient has been having food eversions also she says finally been able to eat and to use the bland diet, patient last BM was this AM. She feels she is starting to feel better. Advised patient to stick with Criss Rosales diet such as BRAT diet for example for another 24 to 48 hours and to add foods back slowly.Also advised if diarrhea returns or starts to have emesis that she needs to be evaluated patient stated  she would. Patient says she will se OBGYN in the next 3 weeks also.

## 2022-05-03 NOTE — Telephone Encounter (Signed)
Patient states she is pregnant and thinks she may have food poison.  Patient states she thinks this may have been caused by a salad she ate two nights ago.  Patient states the following morning she was feeling really bad and it was worse last night and she is still having symptoms this morning.  Patient states she is experiencing nausea and has come close to throwing up several times.  Patient states the only thing she has been able to eat are cashews.  Patient states she has also had diarrhea.  Patient is very concerned and is crying on and off.  Patient's call was transferred to Access Nurse.

## 2022-05-03 NOTE — Telephone Encounter (Signed)
Pt was transferred to access nurse & states she is [redacted] weeks pregnant and thinks she has food poisoning from a salad she had two nights ago. She has nausea and diarrhea. Per access nurse Pt has not had a OB appt yet. Based on RN clinical knowledge, advised to drink lots of fluids and stay on soft bland diet for a few days. Advised may take Dramamine or Vit B 6 OTC per pkg inst for nausea

## 2022-05-04 NOTE — Telephone Encounter (Signed)
Patient says she has had no more diarrhea and no more nausea and is feeling much better.Patient did advise she had a very lite spotting this morning but she also had this with her first pregnancy until she was about 12 weeks and feels this is the same she had before. I advised her she should still call her OB/GYN and advise them of her spotting she stated she would. Denies any cramping, or any ill feeling stating she feels much better.

## 2022-05-04 NOTE — Telephone Encounter (Signed)
Please call and check on pt.  Please confirm doing ok.

## 2022-05-24 ENCOUNTER — Encounter: Payer: Self-pay | Admitting: Certified Nurse Midwife

## 2022-09-05 ENCOUNTER — Ambulatory Visit: Payer: BC Managed Care – PPO

## 2022-09-05 NOTE — Progress Notes (Signed)
Pt presented to office as a walk in. C/o feeling faint and weak for several days.   Pt is [redacted] wks pregnant, w/ h/o iron deficiency. Oral Iron was d/c at 12 wks by OBGYN Gavin Potters [phone]) - due to nausea and levels being normal.  Pt stated she had sent a message to OB but had not heard back. Due to pt gestation, OB was called and pt was booked for 145pm appointment today with Dr. Lanny Cramp .   Pt agreeable to OB appointment and was released.    VITALS  BP : 102/60 HR : 94 O2:99 RESP : 13

## 2022-11-28 ENCOUNTER — Encounter: Payer: BC Managed Care – PPO | Admitting: Internal Medicine

## 2022-12-25 ENCOUNTER — Inpatient Hospital Stay (HOSPITAL_COMMUNITY): Admission: AD | Admit: 2022-12-25 | Payer: BC Managed Care – PPO | Source: Home / Self Care

## 2023-01-02 ENCOUNTER — Other Ambulatory Visit: Payer: Self-pay

## 2023-01-02 ENCOUNTER — Encounter (HOSPITAL_COMMUNITY): Payer: Self-pay

## 2023-01-02 ENCOUNTER — Inpatient Hospital Stay (HOSPITAL_COMMUNITY)
Admission: RE | Admit: 2023-01-02 | Discharge: 2023-01-05 | DRG: 788 | Disposition: A | Discharge status: Home / Self Care | Payer: BC Managed Care – PPO | Attending: Obstetrics | Admitting: Obstetrics

## 2023-01-02 DIAGNOSIS — Z3A41 41 weeks gestation of pregnancy: Secondary | ICD-10-CM

## 2023-01-02 DIAGNOSIS — O480 Post-term pregnancy: Principal | ICD-10-CM | POA: Diagnosis present

## 2023-01-02 DIAGNOSIS — Z98891 History of uterine scar from previous surgery: Secondary | ICD-10-CM

## 2023-01-02 DIAGNOSIS — O321XX0 Maternal care for breech presentation, not applicable or unspecified: Secondary | ICD-10-CM | POA: Diagnosis present

## 2023-01-02 DIAGNOSIS — O9081 Anemia of the puerperium: Secondary | ICD-10-CM | POA: Diagnosis not present

## 2023-01-02 DIAGNOSIS — O9902 Anemia complicating childbirth: Secondary | ICD-10-CM | POA: Diagnosis not present

## 2023-01-02 HISTORY — DX: Anemia, unspecified: D64.9

## 2023-01-02 LAB — CBC
HCT: 37.2 % (ref 36.0–46.0)
Hemoglobin: 12.8 g/dL (ref 12.0–15.0)
MCH: 31.7 pg (ref 26.0–34.0)
MCHC: 34.4 g/dL (ref 30.0–36.0)
MCV: 92.1 fL (ref 80.0–100.0)
Platelets: 266 10*3/uL (ref 150–400)
RBC: 4.04 MIL/uL (ref 3.87–5.11)
RDW: 13.5 % (ref 11.5–15.5)
WBC: 8.6 10*3/uL (ref 4.0–10.5)
nRBC: 0 % (ref 0.0–0.2)

## 2023-01-02 LAB — TYPE AND SCREEN
ABO/RH(D): A POS
Antibody Screen: NEGATIVE

## 2023-01-02 LAB — HIV ANTIBODY (ROUTINE TESTING W REFLEX): HIV Screen 4th Generation wRfx: NONREACTIVE

## 2023-01-02 MED ORDER — OXYTOCIN-SODIUM CHLORIDE 30-0.9 UT/500ML-% IV SOLN: 2.5000 [IU]/h | INTRAVENOUS | Status: DC

## 2023-01-02 MED ORDER — ONDANSETRON HCL 4 MG/2ML IJ SOLN: 4.0000 mg | Freq: Four times a day (QID) | INTRAMUSCULAR | Status: DC | PRN

## 2023-01-02 MED ORDER — SODIUM CHLORIDE 0.9 % IV SOLN: INTRAVENOUS | Status: DC | PRN

## 2023-01-02 MED ORDER — LACTATED RINGERS IV SOLN: 500.0000 mL | INTRAVENOUS | Status: DC | PRN

## 2023-01-02 MED ORDER — SOD CITRATE-CITRIC ACID 500-334 MG/5ML PO SOLN
30.0000 mL | ORAL | Status: DC | PRN
  Administered 2023-01-03: 30 mL via ORAL
  Filled 2023-01-02: qty 30

## 2023-01-02 MED ORDER — OXYTOCIN 10 UNIT/ML IJ SOLN: 10.0000 [IU] | Freq: Once | INTRAMUSCULAR | Status: DC

## 2023-01-02 MED ORDER — MISOPROSTOL 50MCG HALF TABLET
50.0000 ug | ORAL_TABLET | ORAL | Status: DC
  Administered 2023-01-03: 50 ug via BUCCAL
  Filled 2023-01-02: qty 1

## 2023-01-02 MED ORDER — LACTATED RINGERS IV SOLN: INTRAVENOUS | Status: DC

## 2023-01-02 MED ORDER — LIDOCAINE HCL (PF) 1 % IJ SOLN: 30.0000 mL | INTRAMUSCULAR | Status: DC | PRN

## 2023-01-02 MED ORDER — ACETAMINOPHEN 500 MG PO TABS: 1000.0000 mg | ORAL_TABLET | Freq: Four times a day (QID) | ORAL | Status: DC | PRN

## 2023-01-02 MED ORDER — SODIUM CHLORIDE 0.9% FLUSH: 3.0000 mL | INTRAVENOUS | Status: DC | PRN

## 2023-01-02 MED ORDER — FENTANYL CITRATE (PF) 100 MCG/2ML IJ SOLN: 50.0000 ug | INTRAMUSCULAR | Status: DC | PRN

## 2023-01-02 MED ORDER — MISOPROSTOL 50MCG HALF TABLET
50.0000 ug | ORAL_TABLET | Freq: Once | ORAL | Status: AC
  Administered 2023-01-02: 50 ug via BUCCAL
  Filled 2023-01-02: qty 1

## 2023-01-02 MED ORDER — SODIUM CHLORIDE 0.9% FLUSH
3.0000 mL | Freq: Two times a day (BID) | INTRAVENOUS | Status: DC
  Administered 2023-01-02: 3 mL via INTRAVENOUS

## 2023-01-02 MED ORDER — OXYTOCIN BOLUS FROM INFUSION: 333.0000 mL | Freq: Once | INTRAVENOUS | Status: DC

## 2023-01-02 NOTE — H&P (Signed)
OB ADMISSION/ HISTORY & PHYSICAL:  Admission Date: 01/02/2023  5:47 PM  Admit Diagnosis: Normal labor [O80, Z37.9]    Linda Mendez is a 27 y.o. female G2P1001 at [redacted]w[redacted]d presenting for induction of labor for postdates. Reports some contractions. Denies leaking of fluid or vaginal bleeding. Endorses + fetal movement. Husband, [name], present and supportive. Eagerly anticipating baby girl "[name]".  Prenatal History: G2P1001   EDC: 12/25/2022 Prenatal care at [facility] since 10 weeks  Primary: A. 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  Prenatal Labs: ABO, Rh:   A POS Antibody:   Negative Rubella:   Immune RPR:   Non-reactive HBsAg:   Negative HIV:   Negative GBS:   Negative 1 hr Glucola : 91 Genetic Screening: declined Ultrasound: normal anatomy, anterior placenta    Maternal Diabetes: No Genetic Screening: Declined Maternal Ultrasounds/Referrals: Normal Fetal Ultrasounds or other Referrals:  None Maternal Substance Abuse:  No Significant Maternal Medications:  None Significant Maternal Lab Results:  Group B Strep negative Other Comments:  None  Medical / Surgical History : Past medical history:  Past Medical History:  Diagnosis Date   Anemia    Arm fracture, left    Hemorrhoids    Mononucleosis     Past surgical history:  Past Surgical History:  Procedure Laterality Date   arm surgery     blocked tear duct surgery     FRACTURE SURGERY Left 2003   Left arm     Family History:  Family History  Problem Relation Age of Onset   Thyroid disease Mother    Migraines Mother    Hypersomnolence Mother    Hyperlipidemia Father    Hyperlipidemia Paternal Grandfather    Skin cancer Maternal Grandmother    Skin cancer Paternal Grandmother     Social History:  reports that she has never smoked. She has never used smokeless tobacco. She reports that she does not currently use alcohol. She reports that she  does not use drugs.  Allergies: Amoxicillin and Penicillins   Current Medications at time of admission:  Medications Prior to Admission  Medication Sig Dispense Refill Last Dose   hydrocortisone (ANUSOL-HC) 25 MG suppository Place 1 suppository (25 mg total) rectally 2 (two) times daily. 12 suppository 0    ibuprofen (ADVIL) 600 MG tablet Take 1 tablet (600 mg total) by mouth every 6 (six) hours. 30 tablet 0    ipratropium (ATROVENT) 0.03 % nasal spray Place 2 sprays into both nostrils every 12 (twelve) hours. 30 mL 0    Misc. Devices (BREAST PUMP) MISC Dispense one breast pump for patient 1 each 0     Review of Systems: Review of Systems  All other systems reviewed and are negative.  Physical Exam: Vital signs and nursing notes reviewed.  Patient Vitals for the past 24 hrs:  BP Temp Temp src Pulse Resp Height Weight  01/02/23 1830 119/73 97.9 F (36.6 C) Oral 84 19 5\' 3"  (1.6 m) 70.3 kg    General: AAO x 3, NAD Heart: RRR Lungs:CTAB Abdomen: Gravid, NT Extremities: no edema SVE: Dilation: 1 Cervical Position: Posterior Presentation: Vertex Exam by:: Dorisann Frames   FHR: 135BPM, moderate variability, + accels, no decels TOCO: Contractions irregular  Labs:   Recent Labs    01/02/23 1819  WBC 8.6  HGB 12.8  HCT 37.2  PLT 266   Assessment/Plan: 27 y.o. G2P1001 at [redacted]w[redacted]d, IOL for postdates, history of VAVD  Fetal wellbeing - FHT category 1 EFW AGA 7-8lbs  Labor: Cervix soft but vertex high, will give buccal Cytotec q 4 hours, consider Foley balloon and AROM when able  GBS negative Rubella immune Rh positive  Pain control: desires unmedicated birth, waterbirth class complete last pregnancy, open to epidural Analgesia/anesthesia PRN  Anticipated MOD: NSVB  Plans to breastfeed. POC discussed with patient and support team, all questions answered.  Dr. Ernestina Penna notified of admission/plan of care.  June Leap CNM, MSN 01/02/2023, 6:38 PM

## 2023-01-02 NOTE — Progress Notes (Signed)
Orders for next dose of Cytotec po. Contractions occurring every 2-4 minutes. Will hold med at this time. Pain level 5/10. Patient ambulating on unit and breathing through contractions.

## 2023-01-02 NOTE — Plan of Care (Signed)

## 2023-01-02 NOTE — Progress Notes (Signed)
S: Feeling some cramping. Husband, [name], present and supportive. Discussed the R/B/A of Foley balloon placement and patient consents to procedure.   O: Vitals:   01/02/23 1830 01/02/23 2015  BP: 119/73 113/65  Pulse: 84 86  Resp: 19 18  Temp: 97.9 F (36.6 C)   TempSrc: Oral   Weight: 70.3 kg   Height: 5\' 3"  (1.6 m)    FHT:  FHR: 140 bpm, variability: moderate,  accelerations:  Present,  decelerations:  Absent UC:   irregular, every 2-6 minutes SVE:   Dilation: 3/60/-3 Exam by:: Dorisann Frames  Foley balloon placed without difficulty. 60cc of fluid instilled in balloon and taped to leg for traction. Patient tolerated procedure well.   A / P: Induction of labor due to postterm, s/p one dose of buccal Cytotec, Foley balloon placed  Fetal Wellbeing:  Category I GBS: Negative Pain Control:  Labor support without medications Anticipated MOD:  NSVD  Continue buccal Cytotec every 4 hours. AROM after Foley balloon is expelled.   June Leap, CNM, MSN 01/02/2023, 10:11 PM

## 2023-01-03 ENCOUNTER — Other Ambulatory Visit: Payer: Self-pay

## 2023-01-03 ENCOUNTER — Encounter (HOSPITAL_COMMUNITY)
Admission: RE | Disposition: A | Discharge status: Home / Self Care | Payer: Self-pay | Source: Home / Self Care | Attending: Obstetrics

## 2023-01-03 ENCOUNTER — Encounter (HOSPITAL_COMMUNITY): Payer: Self-pay | Admitting: Obstetrics

## 2023-01-03 ENCOUNTER — Inpatient Hospital Stay (HOSPITAL_COMMUNITY): Payer: BC Managed Care – PPO | Admitting: Anesthesiology

## 2023-01-03 DIAGNOSIS — Z98891 History of uterine scar from previous surgery: Secondary | ICD-10-CM

## 2023-01-03 LAB — RPR: RPR Ser Ql: NONREACTIVE

## 2023-01-03 SURGERY — Surgical Case
Anesthesia: Epidural

## 2023-01-03 MED ORDER — PROMETHAZINE HCL 25 MG/ML IJ SOLN: 6.2500 mg | INTRAMUSCULAR | Status: DC | PRN

## 2023-01-03 MED ORDER — SIMETHICONE 80 MG PO CHEW
80.0000 mg | CHEWABLE_TABLET | Freq: Three times a day (TID) | ORAL | Status: DC
  Administered 2023-01-03 – 2023-01-04 (×3): 80 mg via ORAL
  Filled 2023-01-03 (×7): qty 1

## 2023-01-03 MED ORDER — ACETAMINOPHEN 10 MG/ML IV SOLN
1000.0000 mg | Freq: Once | INTRAVENOUS | Status: DC | PRN
  Administered 2023-01-03: 1000 mg via INTRAVENOUS

## 2023-01-03 MED ORDER — EPHEDRINE 5 MG/ML INJ: 10.0000 mg | INTRAVENOUS | Status: DC | PRN

## 2023-01-03 MED ORDER — MENTHOL 3 MG MT LOZG: 1.0000 | LOZENGE | OROMUCOSAL | Status: DC | PRN

## 2023-01-03 MED ORDER — NALOXONE HCL 4 MG/10ML IJ SOLN: 1.0000 ug/kg/h | INTRAVENOUS | Status: DC | PRN

## 2023-01-03 MED ORDER — ZOLPIDEM TARTRATE 5 MG PO TABS: 5.0000 mg | ORAL_TABLET | Freq: Every evening | ORAL | Status: DC | PRN

## 2023-01-03 MED ORDER — MORPHINE SULFATE (PF) 0.5 MG/ML IJ SOLN
INTRAMUSCULAR | Status: DC | PRN
  Administered 2023-01-03: 150 ug via INTRATHECAL

## 2023-01-03 MED ORDER — SCOPOLAMINE 1 MG/3DAYS TD PT72
MEDICATED_PATCH | TRANSDERMAL | Status: AC
  Filled 2023-01-03: qty 1

## 2023-01-03 MED ORDER — IBUPROFEN 600 MG PO TABS
600.0000 mg | ORAL_TABLET | Freq: Four times a day (QID) | ORAL | Status: DC
  Administered 2023-01-04 – 2023-01-05 (×7): 600 mg via ORAL
  Filled 2023-01-03 (×6): qty 1

## 2023-01-03 MED ORDER — SIMETHICONE 80 MG PO CHEW: 80.0000 mg | CHEWABLE_TABLET | ORAL | Status: DC | PRN

## 2023-01-03 MED ORDER — DIBUCAINE (PERIANAL) 1 % EX OINT: 1.0000 | TOPICAL_OINTMENT | CUTANEOUS | Status: DC | PRN

## 2023-01-03 MED ORDER — NALOXONE HCL 0.4 MG/ML IJ SOLN: 0.4000 mg | INTRAMUSCULAR | Status: DC | PRN

## 2023-01-03 MED ORDER — PHENYLEPHRINE HCL (PRESSORS) 10 MG/ML IV SOLN
INTRAVENOUS | Status: DC | PRN
  Administered 2023-01-03: 70.3 ug via INTRAVENOUS

## 2023-01-03 MED ORDER — PHENYLEPHRINE HCL-NACL 20-0.9 MG/250ML-% IV SOLN
INTRAVENOUS | Status: AC
  Filled 2023-01-03: qty 250

## 2023-01-03 MED ORDER — CEFAZOLIN SODIUM-DEXTROSE 2-4 GM/100ML-% IV SOLN
INTRAVENOUS | Status: AC
  Filled 2023-01-03: qty 100

## 2023-01-03 MED ORDER — PRENATAL MULTIVITAMIN CH
1.0000 | ORAL_TABLET | Freq: Every day | ORAL | Status: DC
  Administered 2023-01-03 – 2023-01-05 (×3): 1 via ORAL
  Filled 2023-01-03 (×3): qty 1

## 2023-01-03 MED ORDER — ONDANSETRON HCL 4 MG/2ML IJ SOLN
4.0000 mg | Freq: Three times a day (TID) | INTRAMUSCULAR | Status: DC | PRN
  Administered 2023-01-05: 4 mg via INTRAVENOUS
  Filled 2023-01-03: qty 2

## 2023-01-03 MED ORDER — FENTANYL CITRATE (PF) 100 MCG/2ML IJ SOLN
INTRAMUSCULAR | Status: DC | PRN
  Administered 2023-01-03: 15 ug via INTRATHECAL

## 2023-01-03 MED ORDER — PHENYLEPHRINE 80 MCG/ML (10ML) SYRINGE FOR IV PUSH (FOR BLOOD PRESSURE SUPPORT): 80.0000 ug | PREFILLED_SYRINGE | INTRAVENOUS | Status: DC | PRN

## 2023-01-03 MED ORDER — OXYTOCIN-SODIUM CHLORIDE 30-0.9 UT/500ML-% IV SOLN
INTRAVENOUS | Status: DC | PRN
  Administered 2023-01-03: 30 [IU] via INTRAVENOUS

## 2023-01-03 MED ORDER — DEXAMETHASONE SODIUM PHOSPHATE 10 MG/ML IJ SOLN
INTRAMUSCULAR | Status: DC | PRN
  Administered 2023-01-03: 10 mg via INTRAVENOUS

## 2023-01-03 MED ORDER — OXYCODONE HCL 5 MG/5ML PO SOLN: 5.0000 mg | Freq: Once | ORAL | Status: DC | PRN

## 2023-01-03 MED ORDER — KETOROLAC TROMETHAMINE 30 MG/ML IJ SOLN
30.0000 mg | Freq: Four times a day (QID) | INTRAMUSCULAR | Status: AC
  Administered 2023-01-03 (×3): 30 mg via INTRAVENOUS
  Filled 2023-01-03 (×3): qty 1

## 2023-01-03 MED ORDER — TETANUS-DIPHTH-ACELL PERTUSSIS 5-2.5-18.5 LF-MCG/0.5 IM SUSY: 0.5000 mL | PREFILLED_SYRINGE | Freq: Once | INTRAMUSCULAR | Status: DC

## 2023-01-03 MED ORDER — LACTATED RINGERS IV SOLN
500.0000 mL | Freq: Once | INTRAVENOUS | Status: AC
  Administered 2023-01-03: 500 mL via INTRAVENOUS

## 2023-01-03 MED ORDER — BUPIVACAINE HCL (PF) 0.25 % IJ SOLN
INTRAMUSCULAR | Status: AC
  Filled 2023-01-03: qty 10

## 2023-01-03 MED ORDER — MORPHINE SULFATE (PF) 0.5 MG/ML IJ SOLN
INTRAMUSCULAR | Status: AC
  Filled 2023-01-03: qty 10

## 2023-01-03 MED ORDER — DIPHENHYDRAMINE HCL 50 MG/ML IJ SOLN
12.5000 mg | Freq: Four times a day (QID) | INTRAMUSCULAR | Status: DC | PRN
  Administered 2023-01-03: 12.5 mg via INTRAVENOUS

## 2023-01-03 MED ORDER — LACTATED RINGERS IV SOLN: INTRAVENOUS | Status: DC

## 2023-01-03 MED ORDER — SCOPOLAMINE 1 MG/3DAYS TD PT72
1.0000 | MEDICATED_PATCH | Freq: Once | TRANSDERMAL | Status: DC
  Administered 2023-01-03: 1.5 mg via TRANSDERMAL

## 2023-01-03 MED ORDER — DEXAMETHASONE SODIUM PHOSPHATE 10 MG/ML IJ SOLN
INTRAMUSCULAR | Status: AC
  Filled 2023-01-03: qty 1

## 2023-01-03 MED ORDER — CEFAZOLIN SODIUM-DEXTROSE 2-3 GM-%(50ML) IV SOLR
INTRAVENOUS | Status: DC | PRN
  Administered 2023-01-03: 2 g via INTRAVENOUS

## 2023-01-03 MED ORDER — OXYCODONE HCL 5 MG PO TABS: 5.0000 mg | ORAL_TABLET | ORAL | Status: DC | PRN

## 2023-01-03 MED ORDER — SODIUM CHLORIDE 0.9% FLUSH: 3.0000 mL | INTRAVENOUS | Status: DC | PRN

## 2023-01-03 MED ORDER — SENNOSIDES-DOCUSATE SODIUM 8.6-50 MG PO TABS
2.0000 | ORAL_TABLET | ORAL | Status: DC
  Administered 2023-01-03 – 2023-01-05 (×3): 2 via ORAL
  Filled 2023-01-03 (×3): qty 2

## 2023-01-03 MED ORDER — ACETAMINOPHEN 10 MG/ML IV SOLN
INTRAVENOUS | Status: AC
  Filled 2023-01-03: qty 100

## 2023-01-03 MED ORDER — FENTANYL CITRATE (PF) 100 MCG/2ML IJ SOLN: 25.0000 ug | INTRAMUSCULAR | Status: DC | PRN

## 2023-01-03 MED ORDER — OXYTOCIN-SODIUM CHLORIDE 30-0.9 UT/500ML-% IV SOLN
INTRAVENOUS | Status: AC
  Filled 2023-01-03: qty 500

## 2023-01-03 MED ORDER — DIPHENHYDRAMINE HCL 50 MG/ML IJ SOLN: 12.5000 mg | INTRAMUSCULAR | Status: DC | PRN

## 2023-01-03 MED ORDER — DIPHENHYDRAMINE HCL 50 MG/ML IJ SOLN
INTRAMUSCULAR | Status: AC
  Filled 2023-01-03: qty 1

## 2023-01-03 MED ORDER — FENTANYL-BUPIVACAINE-NACL 0.5-0.125-0.9 MG/250ML-% EP SOLN
12.0000 mL/h | EPIDURAL | Status: DC | PRN
  Filled 2023-01-03: qty 250

## 2023-01-03 MED ORDER — OXYCODONE HCL 5 MG PO TABS: 5.0000 mg | ORAL_TABLET | Freq: Once | ORAL | Status: DC | PRN

## 2023-01-03 MED ORDER — ONDANSETRON HCL 4 MG/2ML IJ SOLN
INTRAMUSCULAR | Status: AC
  Filled 2023-01-03: qty 2

## 2023-01-03 MED ORDER — ACETAMINOPHEN 500 MG PO TABS
1000.0000 mg | ORAL_TABLET | Freq: Four times a day (QID) | ORAL | Status: DC
  Administered 2023-01-03 – 2023-01-05 (×8): 1000 mg via ORAL
  Filled 2023-01-03 (×8): qty 2

## 2023-01-03 MED ORDER — DIPHENHYDRAMINE HCL 25 MG PO CAPS: 25.0000 mg | ORAL_CAPSULE | Freq: Four times a day (QID) | ORAL | Status: DC | PRN

## 2023-01-03 MED ORDER — OXYTOCIN-SODIUM CHLORIDE 30-0.9 UT/500ML-% IV SOLN
2.5000 [IU]/h | INTRAVENOUS | Status: AC
  Administered 2023-01-03: 2.5 [IU]/h via INTRAVENOUS
  Filled 2023-01-03: qty 500

## 2023-01-03 MED ORDER — KETOROLAC TROMETHAMINE 30 MG/ML IJ SOLN
30.0000 mg | Freq: Once | INTRAMUSCULAR | Status: AC | PRN
  Administered 2023-01-03: 30 mg via INTRAVENOUS

## 2023-01-03 MED ORDER — MEPERIDINE HCL 25 MG/ML IJ SOLN: 6.2500 mg | INTRAMUSCULAR | Status: DC | PRN

## 2023-01-03 MED ORDER — TERBUTALINE SULFATE 1 MG/ML IJ SOLN
INTRAMUSCULAR | Status: AC
  Administered 2023-01-03: 0.25 mg
  Filled 2023-01-03: qty 1

## 2023-01-03 MED ORDER — ONDANSETRON HCL 4 MG/2ML IJ SOLN
INTRAMUSCULAR | Status: DC | PRN
  Administered 2023-01-03: 4 mg via INTRAVENOUS

## 2023-01-03 MED ORDER — FENTANYL CITRATE (PF) 100 MCG/2ML IJ SOLN
INTRAMUSCULAR | Status: AC
  Filled 2023-01-03: qty 2

## 2023-01-03 MED ORDER — PHENYLEPHRINE HCL-NACL 20-0.9 MG/250ML-% IV SOLN
INTRAVENOUS | Status: DC | PRN
  Administered 2023-01-03: 60 ug/min via INTRAVENOUS
  Administered 2023-01-03: 40 ug/min via INTRAVENOUS

## 2023-01-03 MED ORDER — WITCH HAZEL-GLYCERIN EX PADS: 1.0000 | MEDICATED_PAD | CUTANEOUS | Status: DC | PRN

## 2023-01-03 MED ORDER — KETOROLAC TROMETHAMINE 30 MG/ML IJ SOLN
INTRAMUSCULAR | Status: AC
  Filled 2023-01-03: qty 1

## 2023-01-03 MED ORDER — AMISULPRIDE (ANTIEMETIC) 5 MG/2ML IV SOLN: 10.0000 mg | Freq: Once | INTRAVENOUS | Status: DC | PRN

## 2023-01-03 MED ORDER — COCONUT OIL OIL: 1.0000 | TOPICAL_OIL | Status: DC | PRN

## 2023-01-03 MED ORDER — BUPIVACAINE IN DEXTROSE 0.75-8.25 % IT SOLN
INTRATHECAL | Status: DC | PRN
  Administered 2023-01-03: 1.6 mL via INTRATHECAL

## 2023-01-03 SURGICAL SUPPLY — 34 items
BENZOIN TINCTURE PRP APPL 2/3 (GAUZE/BANDAGES/DRESSINGS) IMPLANT
CHLORAPREP W/TINT 26 (MISCELLANEOUS) ×2 IMPLANT
CLAMP UMBILICAL CORD (MISCELLANEOUS) ×1 IMPLANT
CLOTH BEACON ORANGE TIMEOUT ST (SAFETY) ×1 IMPLANT
DRSG OPSITE POSTOP 4X10 (GAUZE/BANDAGES/DRESSINGS) ×1 IMPLANT
ELECT REM PT RETURN 9FT ADLT (ELECTROSURGICAL) ×1
ELECTRODE REM PT RTRN 9FT ADLT (ELECTROSURGICAL) ×1 IMPLANT
EXTRACTOR VACUUM M CUP 4 TUBE (SUCTIONS) IMPLANT
GLOVE BIO SURGEON STRL SZ 6.5 (GLOVE) ×1 IMPLANT
GLOVE BIOGEL PI IND STRL 7.0 (GLOVE) ×3 IMPLANT
GOWN STRL REUS W/TWL LRG LVL3 (GOWN DISPOSABLE) ×2 IMPLANT
KIT ABG SYR 3ML LUER SLIP (SYRINGE) IMPLANT
NDL HYPO 25X5/8 SAFETYGLIDE (NEEDLE) IMPLANT
NEEDLE HYPO 22GX1.5 SAFETY (NEEDLE) IMPLANT
NEEDLE HYPO 25X5/8 SAFETYGLIDE (NEEDLE) IMPLANT
NS IRRIG 1000ML POUR BTL (IV SOLUTION) ×1 IMPLANT
PACK C SECTION WH (CUSTOM PROCEDURE TRAY) ×1 IMPLANT
PAD OB MATERNITY 4.3X12.25 (PERSONAL CARE ITEMS) ×1 IMPLANT
STRIP CLOSURE SKIN 1/2X4 (GAUZE/BANDAGES/DRESSINGS) IMPLANT
SUT MON AB 4-0 PS1 27 (SUTURE) ×1 IMPLANT
SUT PLAIN 0 NONE (SUTURE) IMPLANT
SUT PLAIN 2 0 (SUTURE) ×1
SUT PLAIN 2 0 XLH (SUTURE) IMPLANT
SUT PLAIN ABS 2-0 CT1 27XMFL (SUTURE) IMPLANT
SUT VIC AB 0 CT1 36 (SUTURE) ×2 IMPLANT
SUT VIC AB 0 CTX 36 (SUTURE) ×3
SUT VIC AB 0 CTX36XBRD ANBCTRL (SUTURE) ×2 IMPLANT
SUT VIC AB 2-0 CT1 27 (SUTURE) ×2
SUT VIC AB 2-0 CT1 TAPERPNT 27 (SUTURE) ×1 IMPLANT
SUT VIC AB 4-0 KS 27 (SUTURE) IMPLANT
SYR CONTROL 10ML LL (SYRINGE) IMPLANT
TOWEL OR 17X24 6PK STRL BLUE (TOWEL DISPOSABLE) ×1 IMPLANT
TRAY FOLEY W/BAG SLVR 14FR LF (SET/KITS/TRAYS/PACK) IMPLANT
WATER STERILE IRR 1000ML POUR (IV SOLUTION) ×1 IMPLANT

## 2023-01-03 NOTE — Anesthesia Procedure Notes (Signed)
Spinal  Patient location during procedure: OR Start time: 01/03/2023 3:50 AM End time: 01/03/2023 3:56 AM Reason for block: surgical anesthesia Staffing Performed: anesthesiologist  Anesthesiologist: Leonides Grills, MD Performed by: Leonides Grills, MD Authorized by: Leonides Grills, MD   Preanesthetic Checklist Completed: patient identified, IV checked, risks and benefits discussed, surgical consent, monitors and equipment checked, pre-op evaluation and timeout performed Spinal Block Patient position: sitting Prep: DuraPrep Patient monitoring: cardiac monitor, continuous pulse ox and blood pressure Approach: midline Location: L4-5 Injection technique: single-shot Needle Needle type: Pencan  Needle gauge: 24 G Needle length: 9 cm Assessment Sensory level: T10 Events: CSF return Additional Notes Functioning IV was confirmed and monitors were applied. Sterile prep and drape, including hand hygiene and sterile gloves were used. The patient was positioned and the spine was prepped. The skin was anesthetized with lidocaine.  Free flow of clear CSF was obtained prior to injecting local anesthetic into the CSF.  The spinal needle aspirated freely following injection.  The needle was carefully withdrawn.  The patient tolerated the procedure well.

## 2023-01-03 NOTE — Plan of Care (Signed)
Problem: Education: Goal: Knowledge of General Education information will improve Description: Including pain rating scale, medication(s)/side effects and non-pharmacologic comfort measures Outcome: Progressing   Problem: Health Behavior/Discharge Planning: Goal: Ability to manage health-related needs will improve Outcome: Progressing   Problem: Clinical Measurements: Goal: Ability to maintain clinical measurements within normal limits will improve Outcome: Progressing Goal: Will remain free from infection Outcome: Progressing Goal: Diagnostic test results will improve Outcome: Progressing Goal: Respiratory complications will improve Outcome: Progressing Goal: Cardiovascular complication will be avoided Outcome: Progressing   Problem: Activity: Goal: Risk for activity intolerance will decrease Outcome: Progressing   Problem: Nutrition: Goal: Adequate nutrition will be maintained Outcome: Progressing   Problem: Coping: Goal: Level of anxiety will decrease Outcome: Progressing   Problem: Elimination: Goal: Will not experience complications related to bowel motility Outcome: Progressing Goal: Will not experience complications related to urinary retention Outcome: Progressing   Problem: Pain Managment: Goal: General experience of comfort will improve Outcome: Progressing   Problem: Safety: Goal: Ability to remain free from injury will improve Outcome: Progressing   Problem: Skin Integrity: Goal: Risk for impaired skin integrity will decrease Outcome: Progressing   Problem: Education: Goal: Knowledge of Childbirth will improve Outcome: Progressing Goal: Ability to make informed decisions regarding treatment and plan of care will improve Outcome: Progressing Goal: Ability to state and carry out methods to decrease the pain will improve Outcome: Progressing Goal: Individualized Educational Video(s) Outcome: Progressing   Problem: Coping: Goal: Ability to  verbalize concerns and feelings about labor and delivery will improve Outcome: Progressing   Problem: Life Cycle: Goal: Ability to make normal progression through stages of labor will improve Outcome: Progressing Goal: Ability to effectively push during vaginal delivery will improve Outcome: Progressing   Problem: Role Relationship: Goal: Will demonstrate positive interactions with the child Outcome: Progressing   Problem: Safety: Goal: Risk of complications during labor and delivery will decrease Outcome: Progressing   Problem: Pain Management: Goal: Relief or control of pain from uterine contractions will improve Outcome: Progressing   Problem: Education: Goal: Knowledge of the prescribed therapeutic regimen will improve Outcome: Progressing Goal: Understanding of sexual limitations or changes related to disease process or condition will improve Outcome: Progressing Goal: Individualized Educational Video(s) Outcome: Progressing   Problem: Self-Concept: Goal: Communication of feelings regarding changes in body function or appearance will improve Outcome: Progressing   Problem: Skin Integrity: Goal: Demonstration of wound healing without infection will improve Outcome: Progressing   Problem: Education: Goal: Knowledge of condition will improve Outcome: Progressing Goal: Individualized Educational Video(s) Outcome: Progressing Goal: Individualized Newborn Educational Video(s) Outcome: Progressing   Problem: Activity: Goal: Will verbalize the importance of balancing activity with adequate rest periods Outcome: Progressing Goal: Ability to tolerate increased activity will improve Outcome: Progressing   Problem: Coping: Goal: Ability to identify and utilize available resources and services will improve Outcome: Progressing   Problem: Life Cycle: Goal: Chance of risk for complications during the postpartum period will decrease Outcome: Progressing   Problem: Role  Relationship: Goal: Ability to demonstrate positive interaction with newborn will improve Outcome: Progressing   Problem: Skin Integrity: Goal: Demonstration of wound healing without infection will improve Outcome: Progressing   Problem: Education: Goal: Knowledge of condition will improve Outcome: Progressing Goal: Individualized Educational Video(s) Outcome: Progressing Goal: Individualized Newborn Educational Video(s) Outcome: Progressing   Problem: Activity: Goal: Will verbalize the importance of balancing activity with adequate rest periods Outcome: Progressing Goal: Ability to tolerate increased activity will improve Outcome: Progressing   Problem: Coping: Goal:   Ability to identify and utilize available resources and services will improve Outcome: Progressing   Problem: Life Cycle: Goal: Chance of risk for complications during the postpartum period will decrease Outcome: Progressing   

## 2023-01-03 NOTE — Brief Op Note (Signed)
01/02/2023 - 01/03/2023  5:11 AM  PATIENT:  Rogue Bussing Donelan  27 y.o. female  PRE-OPERATIVE DIAGNOSIS:  breech presentation  POST-OPERATIVE DIAGNOSIS:  breech presentation  PROCEDURE:  Procedure(s): CESAREAN SECTION (N/A) Primary low transverse cesarean section with 2 layer closure  SURGEON:  Surgeon(s) and Role:    Noland Fordyce, MD - Primary  PHYSICIAN ASSISTANT:   ASSISTANTS: Dorisann Frames, CNM  ANESTHESIA:   spinal  EBL: Per nursing notes  BLOOD ADMINISTERED:none  DRAINS: Urinary Catheter (Foley)   LOCAL MEDICATIONS USED:  NONE  SPECIMEN:  Source of Specimen:  Placenta  DISPOSITION OF SPECIMEN:   Disposal  COUNTS:  YES  TOURNIQUET:  * No tourniquets in log *  DICTATION: .Note written in EPIC  PLAN OF CARE: Admit to inpatient   PATIENT DISPOSITION:  PACU - hemodynamically stable.   Delay start of Pharmacological VTE agent (>24hrs) due to surgical blood loss or risk of bleeding: yes

## 2023-01-03 NOTE — Transfer of Care (Signed)
Immediate Anesthesia Transfer of Care Note  Patient: Linda Mendez  Procedure(s) Performed: CESAREAN SECTION  Patient Location: PACU  Anesthesia Type:Spinal  Level of Consciousness: awake, alert , and oriented  Airway & Oxygen Therapy: Patient Spontanous Breathing  Post-op Assessment: Report given to RN and Post -op Vital signs reviewed and stable  Post vital signs: Reviewed and stable  Last Vitals:  Vitals Value Taken Time  BP 97/70 01/03/23 0530  Temp    Pulse 140 01/03/23 0539  Resp 17 01/03/23 0539  SpO2 99 % 01/03/23 0539  Vitals shown include unvalidated device data.  Last Pain:  Vitals:   01/03/23 0526  TempSrc: Oral  PainSc: 0-No pain      Patients Stated Pain Goal: 2 (01/02/23 2300)  Complications: No notable events documented.

## 2023-01-03 NOTE — Progress Notes (Signed)
 bolus of LR completed. Total volume infused for decreased blood pressures 1000 ml. See flow sheet for vital signs. Dr Stephannie Peters notified of blood pressure during and after completion of infusion. Orders received to transfer patient after 20 minutes if blood pressures remain stable.

## 2023-01-03 NOTE — Lactation Note (Signed)
This note was copied from a baby's chart. Lactation Consultation Note  Patient Name: Linda Mendez [id number] Date: 01/03/2023 Age:28 hours Reason for consult: Initial assessment;Term;Breastfeeding assistance  The infant was at 4 hours old.  LC entered the room and the infant was in the bassinet.  The birth parent stated that things have gone well with feeding the infant so far.  She did mention that she was told that the infant may have a tongue tie.  She said that she was not feeling any pain at the moment.  LC reviewed milk production and feeding cues with the parents.  LC also informed the parents of outpatient lactation services.  LC encouraged the birth parent to call for assistance with breastfeeding and wrote my name on the board.  All questions were answered.   Infant Feeding Plan:  Breastfeed 8+ times in 24 hours according to feeding cues.  Hand express and feed the expressed milk to the infant via a spoon.  Call RN/LC for assistance with breastfeeding.   Maternal Data Has patient been taught Hand Expression?: Yes Does the patient have breastfeeding experience prior to this delivery?: Yes How long did the patient breastfeed?: 14 months  Feeding Mother's Current Feeding Choice: Breast Milk  Interventions Interventions: Breast feeding basics reviewed;Education;LC Services brochure  Discharge Pump: DEBP;Hands Free;Personal  Consult Status Consult Status: Follow-up Date: 01/04/23 Follow-up type: In-patient  Orvil Feil Rodricus Candelaria 01/03/2023, 9:08 AM

## 2023-01-03 NOTE — Progress Notes (Signed)
Patient sitting up for epidural placement. Complaints of increased vaginal pressure with the urge to push. Midwife to bedside for SVE. Bedside ultrasound conducted with second  provider verification from Dr Nobie Putnam. Breech presentation. Dr Brayton Layman enroute for c-section.

## 2023-01-03 NOTE — Progress Notes (Signed)
Patient requests epidural for pain management. Two person assist for patient exiting tub. Amanda to bedside.

## 2023-01-03 NOTE — Progress Notes (Signed)
CTSP- IOL for post-dates. Cytotec x 1,  cervical foley then on AROM at complete/ 0 pt found to be breech.  Uncomplicated pregnancy No prior abd surgeries PCN allergy with rash as a child.   CBC    Component Value Date/Time   WBC 8.6 01/02/2023 1819   RBC 4.04 01/02/2023 1819   HGB 12.8 01/02/2023 1819   HGB 11.3 07/27/2020 1508   HCT 37.2 01/02/2023 1819   HCT 32.6 (L) 07/27/2020 1508   PLT 266 01/02/2023 1819   PLT 217 07/27/2020 1508   MCV 92.1 01/02/2023 1819   MCV 92 07/27/2020 1508   MCH 31.7 01/02/2023 1819   MCHC 34.4 01/02/2023 1819   RDW 13.5 01/02/2023 1819   RDW 12.3 07/27/2020 1508   LYMPHSABS 1.7 03/03/2022 1025   LYMPHSABS 1.5 09/12/2017 0748   MONOABS 0.4 03/03/2022 1025   EOSABS 0.0 03/03/2022 1025   EOSABS 0.1 09/12/2017 0748   BASOSABS 0.0 03/03/2022 1025   BASOSABS 0.0 09/12/2017 0748    A/P: PCS for breech. R/B d/w pt.  Ancef for prophylaxis as only hives as a child Consents to blood if blood.  Lendon Colonel 01/03/2023 3:58 AM

## 2023-01-03 NOTE — Anesthesia Preprocedure Evaluation (Addendum)
Anesthesia Evaluation  Patient identified by MRN, date of birth, ID band Patient awake    Reviewed: Allergy & Precautions, H&P , NPO status , Patient's Chart, lab work & pertinent test results  History of Anesthesia Complications Negative for: history of anesthetic complications  Airway Mallampati: II  TM Distance: >3 FB Neck ROM: full    Dental no notable dental hx. (+) Teeth Intact   Pulmonary neg pulmonary ROS   Pulmonary exam normal        Cardiovascular negative cardio ROS Normal cardiovascular exam     Neuro/Psych negative neurological ROS  negative psych ROS   GI/Hepatic negative GI ROS, Neg liver ROS,,,  Endo/Other  negative endocrine ROS    Renal/GU negative Renal ROS  negative genitourinary   Musculoskeletal   Abdominal   Peds  Hematology negative hematology ROS (+)   Anesthesia Other Findings breech presentation  Reproductive/Obstetrics (+) Pregnancy                             Anesthesia Physical Anesthesia Plan  ASA: 2  Anesthesia Plan: Spinal   Post-op Pain Management:    Induction:   PONV Risk Score and Plan: 2 and Ondansetron, Dexamethasone and Treatment may vary due to age or medical condition  Airway Management Planned: Natural Airway  Additional Equipment:   Intra-op Plan:   Post-operative Plan:   Informed Consent: I have reviewed the patients History and Physical, chart, labs and discussed the procedure including the risks, benefits and alternatives for the proposed anesthesia with the patient or authorized representative who has indicated his/her understanding and acceptance.       Plan Discussed with: CRNA  Anesthesia Plan Comments: (Patient examined by CNM and found to be breech while sitting up in preparation for a labor epidural)       Anesthesia Quick Evaluation

## 2023-01-03 NOTE — Progress Notes (Signed)
Dr Bradley Ferris to bedside. Assessed vital signs. Infuse an additional 500 ml of LR for decreased blood pressures then reassess.

## 2023-01-03 NOTE — Progress Notes (Signed)
Patient requests water therapy for pain control. Water temperature 99.0 f. FHR 135 with moderate variability present. No decelerations or variables noted. Contractions occurring every 1.5-3 minutes apart. Palpate mild-mod. Vitals stable. Two person assist for patient entering tub.

## 2023-01-03 NOTE — Op Note (Signed)
01/02/2023 - 01/03/2023  5:11 AM  PATIENT:  Linda Mendez  27 y.o. female  PRE-OPERATIVE DIAGNOSIS:  breech presentation  POST-OPERATIVE DIAGNOSIS:  breech presentation  PROCEDURE:  Procedure(s): CESAREAN SECTION (N/A) Primary low transverse cesarean section with 2 layer closure  SURGEON:  Surgeon(s) and Role:    Noland Fordyce, MD - Primary  PHYSICIAN ASSISTANT:   ASSISTANTS: Dorisann Frames, CNM  ANESTHESIA:   spinal  EBL: Per nursing notes  BLOOD ADMINISTERED:none  DRAINS: Urinary Catheter (Foley)   LOCAL MEDICATIONS USED:  NONE  SPECIMEN:  Source of Specimen:  Placenta  DISPOSITION OF SPECIMEN:  Disposal  COUNTS:  YES  TOURNIQUET:  * No tourniquets in log *  DICTATION: .Note written in EPIC  PLAN OF CARE: Admit to inpatient   PATIENT DISPOSITION:  PACU - hemodynamically stable.   Delay start of Pharmacological VTE agent (>24hrs) due to surgical blood loss or risk of bleeding: yes    Findings:  @BABYSEXEBC @ infant,  APGAR (1 MIN):   APGAR (5 MINS):   APGAR (10 MINS):   Normal uterus, tubes and ovaries, normal placenta. 3VC, clear amniotic fluid, complete breech presentation  EBL: Per nursing notes cc Antibiotics:   2g Ancef Complications: none  Indications: This is a 27 y.o. year-old, G2, P1 at [redacted]w[redacted]d admitted for postdates induction of labor.  Patient had Cytotec x 1 then cervical Foley placed.  After Foley was expelled patient was uncomfortable and asked for an epidural.  Just prior to the epidural patient was involuntarily pushing.  A large bag of fluid was noted in the vagina.  Artificial rupture of membranes was done and patient was found to be breech presentation.  This was confirmed with ultrasound x 2.  Decision was made to proceed to the OR for cesarean section. Risks benefits and alternatives of the procedure were discussed with the patient who agreed to proceed  Procedure:  After informed consent was obtained the patient was taken to the  operating room where spinal anesthesia was initiated.  She was prepped and draped in the normal sterile fashion in dorsal supine position with a leftward tilt.  A foley catheter was in place.  A Pfannenstiel skin incision was made 2 cm above the pubic symphysis in the midline with the scalpel.  Dissection was carried down with the Bovie cautery until the fascia was reached. The fascia was incised in the midline. The incision was extended laterally with the Mayo scissors. The inferior aspect of the fascial incision was grasped with the Coker clamps, elevated up and the underlying rectus muscles were dissected off sharply. The superior aspect of the fascial incision was grasped with the Coker clamps elevated up and the underlying rectus muscles were dissected off sharply.  The peritoneum was entered sharply. The peritoneal incision was extended superiorly and inferiorly with good visualization of the bladder. The bladder blade was inserted and palpation was done to assess the fetal position and the location of the uterine vessels. The lower segment of the uterus was incised sharply with the scalpel and extended  bluntly in the cephalo-caudal fashion. The infant was grasped, brought to the incision,  rotated and the infant was delivered with fundal pressure. The nose and mouth were bulb suctioned. The cord was clamped and cut after 1 minute delay. The infant was handed off to the waiting pediatrician. The placenta was expressed. The uterus was exteriorized. The uterus was cleared of all clots and debris. The uterine incision was repaired with 0 Vicryl in  a running locked fashion.  A second layer of the same suture was used in an imbricating fashion to obtain excellent hemostasis.  The uterus was then returned to the abdomen, the gutters were cleared of all clots and debris.  Several additional figure-of-eight sutures were placed in the left inferior angle . the uterine incision was reinspected and found to be  hemostatic. The peritoneum was grasped and closed with 2-0 Vicryl in a running fashion. The cut muscle edges and the underside of the fascia were inspected and found to be hemostatic. The fascia was closed with 0 Vicryl in a single layer . The subcutaneous tissue was irrigated. Scarpa's layer was closed with a 2-0 plain gut suture. The skin was closed with a 4-0 Monocryl in a single layer. The patient tolerated the procedure well. Sponge lap and needle counts were correct x3 and patient was taken to the recovery room in a stable condition.  Lendon Colonel 01/03/2023 5:12 AM

## 2023-01-03 NOTE — Progress Notes (Signed)
S: Patient desires epidural. Anesthesia at the bedside. While sitting for epidural, patient felt urge to push.   O: Vitals:   01/02/23 1830 01/02/23 2015 01/03/23 0151  BP: 119/73 113/65 115/77  Pulse: 84 86 92  Resp: 19 18   Temp: 97.9 F (36.6 C)    TempSrc: Oral    Weight: 70.3 kg    Height: 5\' 3"  (1.6 m)     FHT:  FHR: 140 bpm, variability: moderate,  accelerations:  Present,  decelerations:  Absent UC:   regular, every 1-3 minutes SVE:   Dilation: 10 Station: -3 Exam by:: Dorisann Frames, CNM  AROM of a moderate amount of light meconium stained fluid.  Complete breech by SVE.  Beside U/S confirms breech presentation. Dr. Nobie Putnam for second provider verification.   A / P: Induction of labor due to postterm, s/p 2 doses of buccal Cytotec and Foley balloon, AROM with light meconium, COMPLETE BREECH  Fetal Wellbeing:  Category I GBS: Negative Pain Control:  Epidural Anticipated MOD:   PLTCS for malpresentation  Dr. Ernestina Penna notified of breech on exam. En route to the hospital.   Discussed with patient and husband need for urgent C/S for breech presentation and both agree to plan of care.   June Leap, CNM, MSN 01/03/2023, 5:14 AM

## 2023-01-03 NOTE — Progress Notes (Signed)
Patient sitting up for spinal placement. Procedure tolerated and patient repositioned supine with hip tilt. FHR 136.

## 2023-01-04 DIAGNOSIS — O9902 Anemia complicating childbirth: Secondary | ICD-10-CM | POA: Diagnosis not present

## 2023-01-04 LAB — CBC
HCT: 24 % — ABNORMAL LOW (ref 36.0–46.0)
Hemoglobin: 8 g/dL — ABNORMAL LOW (ref 12.0–15.0)
MCH: 31.5 pg (ref 26.0–34.0)
MCHC: 33.3 g/dL (ref 30.0–36.0)
MCV: 94.5 fL (ref 80.0–100.0)
Platelets: 187 10*3/uL (ref 150–400)
RBC: 2.54 MIL/uL — ABNORMAL LOW (ref 3.87–5.11)
RDW: 14 % (ref 11.5–15.5)
WBC: 11.7 10*3/uL — ABNORMAL HIGH (ref 4.0–10.5)
nRBC: 0 % (ref 0.0–0.2)

## 2023-01-04 MED ORDER — MAGNESIUM OXIDE -MG SUPPLEMENT 400 (240 MG) MG PO TABS
400.0000 mg | ORAL_TABLET | Freq: Every day | ORAL | Status: DC
  Administered 2023-01-04 – 2023-01-05 (×2): 400 mg via ORAL
  Filled 2023-01-04 (×2): qty 1

## 2023-01-04 MED ORDER — POLYSACCHARIDE IRON COMPLEX 150 MG PO CAPS
150.0000 mg | ORAL_CAPSULE | ORAL | Status: DC
  Administered 2023-01-05: 150 mg via ORAL
  Filled 2023-01-04: qty 1

## 2023-01-04 MED ORDER — SODIUM CHLORIDE 0.9 % IV SOLN
500.0000 mg | INTRAVENOUS | Status: DC
  Filled 2023-01-04: qty 25

## 2023-01-04 NOTE — Progress Notes (Signed)
      Subjective: POD# 1 Live born female  Birth Weight: 8 lb 8.5 oz (3870 g) APGAR: 9, 9  Newborn Delivery   Birth date/time: 01/03/2023 04:24:16 Delivery type: C-Section, Low Transverse Trial of labor: Yes C-section categorization: Primary     Baby name: Learta Codding Delivering provider: Noland Fordyce   Feeding: breast  Pain control at delivery: Spinal   Reports feeling tired but well.   Patient reports tolerating PO.   Breast symptoms:none Pain controlled with  PO meds Denies HA/SOB/C/P/N/V/dizziness. Flatus present. She reports vaginal bleeding as normal, without clots.  She is ambulating, urinating without difficulty.     Objective:   VS:    Vitals:   01/03/23 1500 01/03/23 1845 01/03/23 2355 01/04/23 0522  BP: (!) 95/58 (!) 92/54 (!) 99/57 96/60  Pulse: 81 72 79 88  Resp: 18 18 16 16   Temp: 98.7 F (37.1 C) 98.7 F (37.1 C) 97.9 F (36.6 C) 98.5 F (36.9 C)  TempSrc:  Oral Oral Oral  SpO2: 100% 100% 98% 98%  Weight:      Height:         Intake/Output Summary (Last 24 hours) at 01/04/2023 1610 Last data filed at 01/03/2023 2110 Gross per 24 hour  Intake 1437.8 ml  Output 2000 ml  Net -562.2 ml        Recent Labs    01/02/23 1819 01/04/23 0712  WBC 8.6 11.7*  HGB 12.8 8.0*  HCT 37.2 24.0*  PLT 266 187     Blood type: --/--/A POS (05/20 2000)  Rubella:   immune    Physical Exam:  General: alert, cooperative, and no distress CV: Regular rate and rhythm Resp: clear Abdomen: soft, nontender, normal bowel sounds Incision: clean, dry, and intact Uterine Fundus: firm, below umbilicus, nontender Lochia: minimal Ext: no edema, redness or tenderness in the calves or thighs  Assessment/Plan: 27 y.o.   POD# 1. [id number]                  Principal Problem:   Postpartum care following cesarean delivery 5/21 Active Problems:   Normal labor   Status post primary low transverse cesarean section - breech in labor   Maternal anemia, with delivery -  ABL  - mildly symptomatic - IV venofer today  - start oral Fe supplement tomorrow  Doing well, stable.               Advance diet as tolerated Encourage rest when baby rests Breastfeeding support Encourage to ambulate Routine post-op care  Neta Mends, CNM, MSN 01/04/2023, 9:27 AM

## 2023-01-04 NOTE — Lactation Note (Signed)
This note was copied from a baby's chart. Lactation Consultation Note  Patient Name: Linda Mendez [id number] Date: 01/04/2023 Age:28 hours Reason for consult: Follow-up assessment;Term;Infant weight loss;Breastfeeding assistance (4.65% WL)  The infant was at 32 hours old.  LC entered the room and the infant was being held by the support parent.  The birth parent was sleeping.  The birth parent stated that the infant has been feeding frequently and she is staying on for 4-6 min.  She said that the infant is falling asleep.  LC updated the feedings and spoke with the parents about cluster feeding.  The birth parent commented that the pediatrician felt that the infant has a short lingual frenulum and a tight labial frenula.  LC encouraged the birth parent to call for assistance at the next feeding to work on positioning the infant, keeping her awake, and getting a more comfortable latch.  The birth parent stated that she will call at the next feeding.   Feeding Mother's Current Feeding Choice: Breast Milk  Interventions Interventions: Education  Consult Status Consult Status: Follow-up Date: 01/04/23 Follow-up type: In-patient   Delene Loll 01/04/2023, 2:43 PM

## 2023-01-04 NOTE — Anesthesia Postprocedure Evaluation (Signed)
Anesthesia Post Note  Patient: Linda Mendez  Procedure(s) Performed: CESAREAN SECTION     Patient location during evaluation: PACU Anesthesia Type: Spinal Level of consciousness: awake Pain management: pain level controlled Vital Signs Assessment: post-procedure vital signs reviewed and stable Respiratory status: spontaneous breathing, nonlabored ventilation and respiratory function stable Cardiovascular status: blood pressure returned to baseline and stable Postop Assessment: no apparent nausea or vomiting Anesthetic complications: no   No notable events documented.  Last Vitals:  Vitals:   01/03/23 2355 01/04/23 0522  BP: (!) 99/57 96/60  Pulse: 79 88  Resp: 16 16  Temp: 36.6 C 36.9 C  SpO2: 98% 98%    Last Pain:  Vitals:   01/04/23 0703  TempSrc:   PainSc: Asleep                 Alverna Fawley P Cobey Raineri

## 2023-01-05 MED ORDER — IBUPROFEN 600 MG PO TABS: 600.0000 mg | ORAL_TABLET | Freq: Four times a day (QID) | ORAL | 0 refills | Status: AC

## 2023-01-05 MED ORDER — OXYCODONE HCL 5 MG PO TABS: 5.0000 mg | ORAL_TABLET | Freq: Four times a day (QID) | ORAL | 0 refills | Status: AC | PRN

## 2023-01-05 MED ORDER — ACETAMINOPHEN 500 MG PO TABS: 1000.0000 mg | ORAL_TABLET | Freq: Four times a day (QID) | ORAL | 0 refills | Status: AC

## 2023-01-05 MED ORDER — MAGNESIUM OXIDE -MG SUPPLEMENT 400 (240 MG) MG PO TABS: 400.0000 mg | ORAL_TABLET | Freq: Every day | ORAL | Status: AC

## 2023-01-05 MED ORDER — SIMETHICONE 80 MG PO CHEW: 80.0000 mg | CHEWABLE_TABLET | ORAL | 0 refills | Status: AC | PRN

## 2023-01-05 MED ORDER — COCONUT OIL OIL: 1.0000 | TOPICAL_OIL | 0 refills | Status: AC | PRN

## 2023-01-05 MED ORDER — POLYSACCHARIDE IRON COMPLEX 150 MG PO CAPS: 150.0000 mg | ORAL_CAPSULE | ORAL | Status: AC

## 2023-01-05 MED ORDER — SENNOSIDES-DOCUSATE SODIUM 8.6-50 MG PO TABS: 2.0000 | ORAL_TABLET | ORAL | Status: AC

## 2023-01-05 NOTE — Discharge Instructions (Signed)
Lactation outpatient support - home visit   Jessica Bowers, IBCLC (lactation consultant)  & Birth Doula  Phone (text or call): [phone] Email: [email or web address]   Linda Coppola RN, MHA, IBCLC at [facility]: Lactation Consultant  [email or web address] Mail: [email or web address] Tel: [phone]   Additional breastfeeding resources:  [facility] https://ibconline.ca/information-sheets/  [employer] [place]  [email or web address]   Other Resources:  Chiropractic specialist   Dr. Leanna Hastings [email or web address]   Craniosacral therapy for baby  Erin Balkind  [email or web address]  Pediatric Dentist (for tongue ties)  Dr. Kate Lambert Spangler, [facility]  Phone: [phone]  [address] [place] [address] [email or web address]  

## 2023-01-05 NOTE — Progress Notes (Signed)
Pt called out with concerns of a "potential blood clot in her left leg." RN in room to assess pt. No c/o of pain or tenderness at the site and no s/s of a blood clot noted. Pt stated, "it feels like a knot." Arlan Organ CNM notified; OB team will assess in the morning. No new orders at this time. Pt comfortable in bed.

## 2023-01-05 NOTE — Discharge Summary (Signed)
OB Discharge Summary  Patient Name: Linda Mendez DOB: 26-May-1996 MRN: [id number]  Date of admission: 01/02/2023 Delivering provider: Noland Fordyce   Admitting diagnosis: Normal labor [O80, Z37.9] Intrauterine pregnancy: [redacted]w[redacted]d     Secondary diagnosis: Patient Active Problem List   Diagnosis Date Noted   Maternal anemia, with delivery - ABL 01/04/2023   Status post primary low transverse cesarean section - breech in labor 01/03/2023   Postpartum care following cesarean delivery 5/21 01/03/2023   Normal labor 01/02/2023   Additional problems:none   Date of discharge: 01/05/2023   Discharge diagnosis: Principal Problem:   Postpartum care following cesarean delivery 5/21 Active Problems:   Normal labor   Status post primary low transverse cesarean section - breech in labor   Maternal anemia, with delivery - ABL                                                              Post partum procedures: IV Venofer  Augmentation: AROM Pain control: Spinal  Laceration:None  Episiotomy:None  Complications: Breech in labor  Hospital course:  Onset of Labor With Unplanned C/S   27 y.o. yo G2P2002 at [redacted]w[redacted]d was admitted in Active Labor on 01/02/2023. Patient had a labor course significant for  [redacted]w[redacted]d admitted for postdates induction of labor.  Patient had Cytotec x 1 then cervical Foley placed.  After Foley was expelled patient was uncomfortable and asked for an epidural.  Just prior to the epidural patient was involuntarily pushing.  A large bag of fluid was noted in the vagina.  Artificial rupture of membranes was done and patient was found to be breech presentation.  This was confirmed with ultrasound x 2. . The patient went for cesarean section due to Malpresentation. Delivery details as follows: Membrane Rupture Time/Date: 3:16 AM ,01/03/2023   Delivery Method:C-Section, Low Transverse  Details of operation can be found in separate operative note. Patient had a postpartum course  complicated by acute blood loss anemia with mild symptoms of fatigue. She was given IV Venofer and started on oral iron supplement. She is ambulating,tolerating a regular diet, passing flatus, and urinating well.  Patient is discharged home in stable condition 01/05/23.  Newborn Data: Birth date:01/03/2023  Birth time:4:24 AM  Gender:Female  Living status:Living  Apgars:9 ,9  Weight:3870 g   Physical exam  Vitals:   01/04/23 0522 01/04/23 1315 01/04/23 2100 01/05/23 0530  BP: 96/60 (!) 91/57 101/64 104/65  Pulse: 88 86 87 80  Resp: 16 18 18 18   Temp: 98.5 F (36.9 C) 98.4 F (36.9 C) 97.6 F (36.4 C) 98.3 F (36.8 C)  TempSrc: Oral Oral Oral Oral  SpO2: 98% 98% 99% 100%  Weight:      Height:       General: alert, cooperative, and no distress Lochia: appropriate Uterine Fundus: firm Incision: Healing well with no significant drainage DVT Evaluation: No cords or calf tenderness. No significant calf/ankle edema. Small varicosity in L popliteal fossa, no redness or tenderness over the site.   Labs: Lab Results  Component Value Date   WBC 11.7 (H) 01/04/2023   HGB 8.0 (L) 01/04/2023   HCT 24.0 (L) 01/04/2023   MCV 94.5 01/04/2023   PLT 187 01/04/2023      Latest Ref Rng & Units  11/26/2021    4:43 PM  CMP  Glucose 65 - 99 mg/dL 85   BUN 7 - 25 mg/dL 18   Creatinine 1.61 - 0.96 mg/dL 0.96   Sodium 045 - 409 mmol/L 139   Potassium 3.5 - 5.3 mmol/L 4.0   Chloride 98 - 110 mmol/L 104   CO2 20 - 32 mmol/L 21   Calcium 8.6 - 10.2 mg/dL 9.4       03/25/9146   82:95 AM 10/21/2020    8:51 AM  Edinburgh Postnatal Depression Scale Screening Tool  I have been able to laugh and see the funny side of things. 0 0  I have looked forward with enjoyment to things. 1 0  I have blamed myself unnecessarily when things went wrong. 0 2  I have been anxious or worried for no good reason. 2 0  I have felt scared or panicky for no good reason. 0 1  Things have been getting on top of me. 0  0  I have been so unhappy that I have had difficulty sleeping. 0 0  I have felt sad or miserable. 1 1  I have been so unhappy that I have been crying. 0 1  The thought of harming myself has occurred to me. 0 0  Edinburgh Postnatal Depression Scale Total 4 5   Discharge instruction:  per After Visit Summary,  [facility] booklet and  "Understanding Mother & Baby Care" hospital booklet After Visit Meds:  Allergies as of 01/05/2023       Reactions   Amoxicillin Hives   Penicillins Hives        Medication List     STOP taking these medications    hydrocortisone 25 MG suppository Commonly known as: ANUSOL-HC   ipratropium 0.03 % nasal spray Commonly known as: ATROVENT       TAKE these medications    acetaminophen 500 MG tablet Commonly known as: TYLENOL Take 2 tablets (1,000 mg total) by mouth every 6 (six) hours.   Breast Pump Misc Dispense one breast pump for patient   coconut oil Oil Apply 1 Application topically as needed.   ibuprofen 600 MG tablet Commonly known as: ADVIL Take 1 tablet (600 mg total) by mouth every 6 (six) hours.   iron polysaccharides 150 MG capsule Commonly known as: Ferrex 150 Take 1 capsule (150 mg total) by mouth every other day.   magnesium oxide 400 (240 Mg) MG tablet Commonly known as: MAG-OX Take 1 tablet (400 mg total) by mouth daily. For prevention of constipation.   oxyCODONE 5 MG immediate release tablet Commonly known as: Oxy IR/ROXICODONE Take 1 tablet (5 mg total) by mouth every 6 (six) hours as needed for up to 5 days for moderate pain.   senna-docusate 8.6-50 MG tablet Commonly known as: Senokot-S Take 2 tablets by mouth daily. Start taking on: Jan 06, 2023   simethicone 80 MG chewable tablet Commonly known as: MYLICON Chew 1 tablet (80 mg total) by mouth as needed for flatulence.       Diet: iron rich diet Activity: Advance as tolerated. Pelvic rest for 6 weeks.  Postpartum contraception: TBA in  office Newborn Data: Live born female  Birth Weight: 8 lb 8.5 oz (3870 g) APGAR: 9, 9  Newborn Delivery   Birth date/time: 01/03/2023 04:24:16 Delivery type: C-Section, Low Transverse Trial of labor: Yes C-section categorization: Primary      named Sunny Baby Feeding: Breast Disposition:home with mother Delivery Report: Review the Delivery Report  for details.   Follow up:  Follow-up Information     June Leap, CNM. Schedule an appointment as soon as possible for a visit in 6 week(s).   Specialty: Certified Nurse Midwife Why: For Postpartum follow-up Contact information: [address] [place] [address] [phone]                Signed: Cipriano Mile, MSN 01/05/2023, 10:26 AM

## 2023-01-11 ENCOUNTER — Telehealth (HOSPITAL_COMMUNITY): Payer: Self-pay | Admitting: *Deleted

## 2023-01-11 NOTE — Telephone Encounter (Signed)
Left phone voicemail message.  Duffy Rhody, RN 01-11-2023 at 4;01pm
